# Patient Record
Sex: Male | Born: 1960 | Race: White | Hispanic: No | Marital: Married | Smoking: Former smoker
Health system: Southern US, Community
[De-identification: ages and names within clinical notes are randomized; demographics above are authoritative.]

## PROBLEM LIST (undated history)

## (undated) DIAGNOSIS — J45909 Unspecified asthma, uncomplicated: Secondary | ICD-10-CM

## (undated) DIAGNOSIS — R05 Cough: Secondary | ICD-10-CM

## (undated) DIAGNOSIS — R059 Cough, unspecified: Secondary | ICD-10-CM

## (undated) HISTORY — PX: KNEE SURGERY: SHX244

## (undated) HISTORY — DX: Cough, unspecified: R05.9

## (undated) HISTORY — PX: WISDOM TOOTH EXTRACTION: SHX21

## (undated) HISTORY — DX: Unspecified asthma, uncomplicated: J45.909

## (undated) HISTORY — DX: Cough: R05

---

## 2001-08-05 ENCOUNTER — Emergency Department (HOSPITAL_COMMUNITY): Admission: EM | Admit: 2001-08-05 | Discharge: 2001-08-05 | Payer: Self-pay | Admitting: *Deleted

## 2004-10-28 ENCOUNTER — Ambulatory Visit: Payer: Self-pay | Admitting: Family Medicine

## 2004-11-11 ENCOUNTER — Ambulatory Visit (HOSPITAL_COMMUNITY): Admission: RE | Admit: 2004-11-11 | Discharge: 2004-11-11 | Payer: Self-pay | Admitting: Orthopedic Surgery

## 2005-02-11 ENCOUNTER — Ambulatory Visit: Payer: Self-pay | Admitting: Family Medicine

## 2005-02-12 ENCOUNTER — Ambulatory Visit: Payer: Self-pay | Admitting: Family Medicine

## 2005-09-07 ENCOUNTER — Ambulatory Visit: Payer: Self-pay | Admitting: Family Medicine

## 2005-09-14 ENCOUNTER — Ambulatory Visit: Payer: Self-pay | Admitting: Family Medicine

## 2006-03-24 ENCOUNTER — Ambulatory Visit: Payer: Self-pay | Admitting: Family Medicine

## 2007-02-16 ENCOUNTER — Ambulatory Visit: Payer: Self-pay | Admitting: Family Medicine

## 2010-07-07 ENCOUNTER — Ambulatory Visit: Payer: Self-pay | Admitting: Diagnostic Radiology

## 2010-07-07 ENCOUNTER — Ambulatory Visit (HOSPITAL_BASED_OUTPATIENT_CLINIC_OR_DEPARTMENT_OTHER): Admission: RE | Admit: 2010-07-07 | Discharge: 2010-07-07 | Payer: Self-pay | Admitting: Family Medicine

## 2010-09-13 ENCOUNTER — Encounter: Payer: Self-pay | Admitting: Family Medicine

## 2013-09-19 ENCOUNTER — Encounter: Payer: Self-pay | Admitting: Internal Medicine

## 2013-09-20 ENCOUNTER — Ambulatory Visit (INDEPENDENT_AMBULATORY_CARE_PROVIDER_SITE_OTHER)
Admission: RE | Admit: 2013-09-20 | Discharge: 2013-09-20 | Disposition: A | Discharge status: Home / Self Care | Payer: BC Managed Care – PPO | Source: Ambulatory Visit | Attending: Internal Medicine | Admitting: Internal Medicine

## 2013-09-20 ENCOUNTER — Ambulatory Visit (INDEPENDENT_AMBULATORY_CARE_PROVIDER_SITE_OTHER): Payer: BC Managed Care – PPO | Admitting: Internal Medicine

## 2013-09-20 ENCOUNTER — Encounter: Payer: Self-pay | Admitting: Internal Medicine

## 2013-09-20 ENCOUNTER — Encounter (INDEPENDENT_AMBULATORY_CARE_PROVIDER_SITE_OTHER): Payer: Self-pay

## 2013-09-20 VITALS — BP 104/80 | HR 99 | Temp 97.9°F | Ht 64.0 in | Wt 197.2 lb

## 2013-09-20 DIAGNOSIS — J4540 Moderate persistent asthma, uncomplicated: Secondary | ICD-10-CM | POA: Insufficient documentation

## 2013-09-20 DIAGNOSIS — R05 Cough: Secondary | ICD-10-CM

## 2013-09-20 DIAGNOSIS — R059 Cough, unspecified: Secondary | ICD-10-CM

## 2013-09-20 MED ORDER — PANTOPRAZOLE SODIUM 40 MG PO TBEC: 40.0000 mg | DELAYED_RELEASE_TABLET | Freq: Every day | ORAL | Status: DC

## 2013-09-20 MED ORDER — PREDNISONE 10 MG PO TABS: ORAL_TABLET | ORAL | Status: DC

## 2013-09-20 MED ORDER — TRAMADOL HCL 50 MG PO TABS: ORAL_TABLET | ORAL | Status: DC

## 2013-09-20 MED ORDER — FAMOTIDINE 20 MG PO TABS: ORAL_TABLET | ORAL | Status: DC

## 2013-09-20 NOTE — Progress Notes (Signed)
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  09/20/2013 1st [facility] office visit/ Wert cc new onset dry cough / sob x 4-5 years, with daily reproducible and non variable doe x walking in a hurry or incline, and cough comes and goes with the cold weather, takes cough drops by the handful.    Allergy eval neg by ENT .> no change with gerd rx or neurontin.  Itching/ sneezing spring and fall  Better p zyrtec takes daily plus flonase if misses dose nose gets stuffed up. Does leave off zyrtec in summer s flare in symptoms    Kouffman Reflux v Neurogenic Cough Differentiator Reflux Comments  Do you awaken from a sound sleep coughing violently?                            With trouble breathing? no   Do you have choking episodes when you cannot  Get enough air, gasping for air ?              no   Do you usually cough when you lie down into  The bed, or when you just lie down to rest ?                          no   Do you usually cough after meals or eating?         no   Do you cough when (or after) you bend over?    no   GERD SCORE     Kouffman Reflux v Neurogenic Cough Differentiator Neurogenic   Do you more-or-less cough all day long? yes   Does change of temperature make you cough? yes   Does laughing or chuckling cause you to cough? no   Do fumes (perfume, automobile fumes, burned  Toast, etc.,) cause you to cough ?      sometime   Does speaking, singing, or talking on the phone cause you to cough   ?               Yes    Neurogenic/Airway score       No obvious day to day or daytime variabilty or assoc   cp or chest tightness, subjective wheeze overt sinus or hb symptoms. No unusual exp hx or h/o childhood pna/ asthma or knowledge of premature birth.  Sleeping ok without nocturnal   or early am exacerbation  of respiratory  c/o's or need for noct saba. Also denies any obvious fluctuation of symptoms with weather or environmental changes or other aggravating or alleviating factors except as outlined above   Current Medications, Allergies, Complete Past Medical History, Past Surgical History, Family History, and Social History were reviewed in [employer] record.            Review of Systems  Constitutional: Negative for fever, chills, activity change, appetite change and unexpected weight change.  HENT: Positive for congestion. Negative for dental problem, postnasal drip, rhinorrhea, sneezing, sore throat, trouble swallowing and voice change.   Eyes: Negative for visual disturbance.  Respiratory: Positive for cough and shortness of breath. Negative for choking.   Cardiovascular: Negative for chest pain  and leg swelling.  Gastrointestinal: Negative for nausea, vomiting and abdominal pain.  Genitourinary: Negative for difficulty urinating.  Musculoskeletal: Negative for arthralgias.  Skin: Negative for rash.  Psychiatric/Behavioral: Negative for behavioral problems and confusion.       Objective:   Physical Exam  amb wm nad  Wt Readings from Last 3 Encounters:  09/20/13 197 lb 3.2 oz (89.449 kg)     HEENT: nl dentition, turbinates, and orophanx. Nl external ear canals without cough reflex   NECK :  without JVD/Nodes/TM/ nl carotid upstrokes bilaterally   LUNGS: no acc muscle use, clear to A and P bilaterally with  cough on insp maneuvers (end)   CV:  RRR  no s3 or murmur or increase in P2, no edema   ABD:  soft and nontender with nl excursion in the supine position. No bruits or organomegaly, bowel sounds nl  MS:  warm without deformities, calf tenderness, cyanosis or clubbing  SKIN: warm and dry without lesions    NEURO:  alert, approp, no deficits    CXR  09/20/2013 :   Vague density in the region of the right middle lobe  likely correlating with nodular foci described on prior CT (07/07/10)reflecting subpleural lymph nodes. No further evidence of acute cardiopulmonary disease     Assessment & Plan:

## 2013-09-20 NOTE — Assessment & Plan Note (Signed)
The most common causes of chronic cough in immunocompetent adults include the following: upper airway cough syndrome (UACS), previously referred to as postnasal drip syndrome (PNDS), which is caused by variety of rhinosinus conditions; (2) asthma; (3) GERD; (4) chronic bronchitis from cigarette smoking or other inhaled environmental irritants; (5) nonasthmatic eosinophilic bronchitis; and (6) bronchiectasis.   These conditions, singly or in combination, have accounted for up to 94% of the causes of chronic cough in prospective studies.   Other conditions have constituted no >6% of the causes in prospective studies These have included bronchogenic carcinoma, chronic interstitial pneumonia, sarcoidosis, left ventricular failure, ACEI-induced cough, and aspiration from a condition associated with pharyngeal dysfunction.    Chronic cough is often simultaneously caused by more than one condition. A single cause has been found from 38 to 82% of the time, multiple causes from 18 to 62%. Multiply caused cough has been the result of three diseases up to 42% of the time.       Most likely this is  Classic Upper airway cough syndrome, so named because it's frequently impossible to sort out how much is  CR/sinusitis with freq throat clearing (which can be related to primary GERD)   vs  causing  secondary (" extra esophageal")  GERD from wide swings in gastric pressure that occur with throat clearing, often  promoting self use of mint and menthol lozenges that reduce the lower esophageal sphincter tone and exacerbate the problem further in a cyclical fashion.   These are the same pts (now being labeled as having "irritable larynx syndrome" by some cough centers) who not infrequently have a history of having failed to tolerate ace inhibitors,  dry powder inhalers or biphosphonates or report having atypical reflux symptoms that don't respond to standard doses of PPI , and are easily confused as having aecopd or asthma  flares by even experienced allergists/ pulmonologists.   Will try cyclical cough regimen then regroup.  See instructions for specific recommendations which were reviewed directly with the patient who was given a copy with highlighter outlining the key components.

## 2013-09-20 NOTE — Patient Instructions (Signed)
Pantoprazole (protonix) 40 mg   Take 30-60 min before first meal of the day and Pepcid 20 mg one bedtime until return to office - this is the best way to tell whether stomach acid is contributing to your problem.    Prednisone 10 mg take  4 each am x 2 days,   2 each am x 2 days,  1 each am x 2 days and stop   GERD (REFLUX)  is an extremely common cause of respiratory symptoms, many times with no significant heartburn at all.    It can be treated with medication, but also with lifestyle changes including avoidance of late meals, excessive alcohol, smoking cessation, and avoid fatty foods, chocolate, peppermint, colas, red wine, and acidic juices such as orange juice.  NO MINT OR MENTHOL PRODUCTS SO NO COUGH DROPS  USE SUGARLESS CANDY INSTEAD (jolley ranchers or Stover's/ Life Savers)  NO OIL BASED VITAMINS - use powdered substitutes.  Take delsym two tsp every 12 hours and supplement if needed with  tramadol 50 mg up to 2 every 4 hours to suppress the urge to cough. Swallowing water or using ice chips/non mint and menthol containing candies (such as lifesavers or sugarless jolly ranchers) are also effective.  You should rest your voice and avoid activities that you know make you cough.  Once you have eliminated the cough for 3 straight days try reducing the tramadol first,  then the delsym as tolerated.   Please remember to go to the   x-ray department downstairs for your tests - we will call you with the results when they are available.  Please schedule a follow up office visit in 4 weeks, sooner if needed

## 2013-09-21 NOTE — Progress Notes (Signed)
Quick Note:  LMTCB ______ 

## 2013-09-22 ENCOUNTER — Telehealth: Payer: Self-pay | Admitting: Internal Medicine

## 2013-09-22 NOTE — Telephone Encounter (Signed)
Call pt: Reviewed cxr and no acute change so no change in recommendations made at ov  Spoke with pt and notified of results per Dr. Wert. Pt verbalized understanding and denied any questions.  

## 2013-10-18 ENCOUNTER — Ambulatory Visit (INDEPENDENT_AMBULATORY_CARE_PROVIDER_SITE_OTHER): Payer: BC Managed Care – PPO | Admitting: Internal Medicine

## 2013-10-18 ENCOUNTER — Encounter: Payer: Self-pay | Admitting: Internal Medicine

## 2013-10-18 VITALS — BP 130/86 | HR 100 | Temp 97.9°F | Ht 64.0 in | Wt 195.4 lb

## 2013-10-18 DIAGNOSIS — R05 Cough: Secondary | ICD-10-CM

## 2013-10-18 DIAGNOSIS — R059 Cough, unspecified: Secondary | ICD-10-CM

## 2013-10-18 NOTE — Patient Instructions (Signed)
Please see patient coordinator before you leave today  to schedule methacholine challenge test next available  Until you have the methacholine challenge keep taking the protonix before bfast and pepcid at bedtime

## 2013-10-18 NOTE — Progress Notes (Signed)
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   History of Present Illness  09/20/2013 1st [facility] office visit/ Orra Nolde cc new onset dry cough / sob x 4-5 years, with daily reproducible and non variable doe x walking in a hurry or incline, and cough comes and goes with the cold weather, takes cough drops by the handful.    Allergy eval neg by ENT .> no change with gerd rx or neurontin.  Itching/ sneezing spring and fall  Better p zyrtec takes daily plus flonase if misses dose nose gets stuffed up. Does leave off zyrtec in summer s flare in symptoms rec Cyclical cough rx> did not complete    10/18/2013 f/u ov/Elleigh Cassetta re: yearly cough oct -April x 5 year  Chief Complaint  Patient presents with  . Follow-up    nonprod cough still present but much improved.  Managed by gum/sugarless candies.  maint on  Zyrtec each am Most tramadol he took was one per day If stays home no cough so no reason to take tramadol - turns out only coughs if goes out in cold.    Kouffman Reflux v Neurogenic Cough Differentiator Reflux Comments  Do you awaken from a sound sleep coughing violently?                            With trouble breathing? no   Do you have choking episodes when you cannot  Get enough air, gasping for air ?              no   Do you usually cough when you lie down into  The bed, or when you just lie down to rest ?                          no   Do you usually cough after meals or eating?         no   Do you cough when (or after) you bend over?    no   GERD SCORE     Kouffman Reflux v Neurogenic Cough Differentiator Neurogenic   Do you more-or-less cough all day long? yes   Does change of temperature make you cough? yes   Does laughing or chuckling cause you to cough?  sometimes   Do fumes (perfume, automobile fumes, burned  Toast, etc.,) cause you to cough ?      sometime   Does speaking, singing, or talking on the phone cause you to cough   ?               Yes    Neurogenic/Airway score        No obvious other patterns in  day to day or daytime variabilty or assoc chronic cough or cp or chest tightness, subjective wheeze overt sinus or hb symptoms. No unusual exp hx or h/o childhood pna/ asthma or knowledge of premature birth.  Sleeping ok without nocturnal  or early am exacerbation  of respiratory  c/o's or need for noct saba. Also denies any obvious fluctuation of symptoms with weather or environmental changes or other aggravating or alleviating factors except as outlined above  Current Medications, Allergies, Complete Past Medical History, Past Surgical History, Family History, and Social History were reviewed in [employer] record.  ROS  The following are not active complaints unless bolded sore throat, dysphagia, dental problems, itching, sneezing,  nasal congestion or excess/ purulent secretions, ear ache,   fever, chills, sweats, unintended wt loss, pleuritic or exertional cp, hemoptysis,  orthopnea pnd or leg swelling, presyncope, palpitations, heartburn, abdominal pain, anorexia, nausea, vomiting, diarrhea  or change in bowel or urinary habits, change in stools or urine, dysuria,hematuria,  rash, arthralgias, visual complaints, headache, numbness weakness or ataxia or problems with walking or coordination,  change in mood/affect or memory.         Objective:   Physical Exam  amb wm nad   10/18/2013        195  Wt Readings from Last 3 Encounters:  09/20/13 197 lb 3.2 oz (89.449 kg)     HEENT: nl dentition, turbinates, and orophanx. Nl external ear canals without cough reflex   NECK :  without JVD/Nodes/TM/ nl carotid upstrokes bilaterally   LUNGS: no acc muscle use, clear to A and P bilaterally with  cough on insp  maneuvers    CV:  RRR  no s3 or murmur or increase in P2, no edema   ABD:  soft and nontender with nl excursion in the supine position. No bruits or organomegaly, bowel sounds nl  MS:  warm without deformities, calf tenderness, cyanosis or clubbing  SKIN: warm and dry without lesions          CXR  09/20/2013 : Vague density in the region of the right middle lobe likely correlating with nodular foci described on prior CT (07/07/10)reflecting subpleural lymph nodes. No further evidence of acute cardiopulmonary disease     Assessment & Plan:

## 2013-10-19 NOTE — Assessment & Plan Note (Signed)
The hx has changed somewhat to suggest this only happens in cold weather exposure more typical of asthma but has improved with gerd diet and rx for gerd  Discussed in detail all the  indications, usual  risks and alternatives  relative to the benefits with patient who agrees to proceed with MCT challenge with max gerd rx in meantime.   See instructions for specific recommendations which were reviewed directly with the patient who was given a copy with highlighter outlining the key components.

## 2013-10-24 ENCOUNTER — Telehealth: Payer: Self-pay | Admitting: Internal Medicine

## 2013-10-24 ENCOUNTER — Ambulatory Visit (HOSPITAL_COMMUNITY)
Admission: RE | Admit: 2013-10-24 | Discharge: 2013-10-24 | Disposition: A | Discharge status: Home / Self Care | Payer: BC Managed Care – PPO | Source: Ambulatory Visit | Attending: Internal Medicine | Admitting: Internal Medicine

## 2013-10-24 DIAGNOSIS — R05 Cough: Secondary | ICD-10-CM

## 2013-10-24 DIAGNOSIS — R059 Cough, unspecified: Secondary | ICD-10-CM

## 2013-10-24 LAB — PULMONARY FUNCTION TEST
FEF 25-75 POST: 2.18 L/s
FEF 25-75 Pre: 4.93 L/sec
FEF2575-%Change-Post: -55 %
FEF2575-%PRED-POST: 82 %
FEF2575-%Pred-Pre: 186 %
FEV1-%CHANGE-POST: -18 %
FEV1-%PRED-POST: 83 %
FEV1-%Pred-Pre: 102 %
FEV1-POST: 2.48 L
FEV1-Pre: 3.05 L
FEV1FVC-%CHANGE-POST: -11 %
FEV1FVC-%Pred-Pre: 113 %
FEV6-%Change-Post: -8 %
FEV6-%PRED-PRE: 95 %
FEV6-%Pred-Post: 87 %
FEV6-PRE: 3.48 L
FEV6-Post: 3.19 L
FEV6FVC-%PRED-POST: 104 %
FEV6FVC-%Pred-Pre: 104 %
FVC-%Change-Post: -7 %
FVC-%Pred-Post: 84 %
FVC-%Pred-Pre: 90 %
FVC-Post: 3.22 L
FVC-Pre: 3.48 L
POST FEV6/FVC RATIO: 100 %
PRE FEV6/FVC RATIO: 100 %
Post FEV1/FVC ratio: 77 %
Pre FEV1/FVC ratio: 87 %

## 2013-10-24 MED ORDER — METHACHOLINE 16 MG/ML NEB SOLN: 2.0000 mL | Freq: Once | RESPIRATORY_TRACT | Status: DC

## 2013-10-24 MED ORDER — METHACHOLINE 0.0625 MG/ML NEB SOLN
2.0000 mL | Freq: Once | RESPIRATORY_TRACT | Status: AC
  Administered 2013-10-24: 0.125 mg via RESPIRATORY_TRACT

## 2013-10-24 MED ORDER — METHACHOLINE 1 MG/ML NEB SOLN
2.0000 mL | Freq: Once | RESPIRATORY_TRACT | Status: AC
  Administered 2013-10-24: 2 mg via RESPIRATORY_TRACT

## 2013-10-24 MED ORDER — ALBUTEROL SULFATE (2.5 MG/3ML) 0.083% IN NEBU
2.5000 mg | INHALATION_SOLUTION | Freq: Once | RESPIRATORY_TRACT | Status: AC
Start: 2013-10-24 — End: 2013-10-24
  Administered 2013-10-24: 2.5 mg via RESPIRATORY_TRACT

## 2013-10-24 MED ORDER — METHACHOLINE 4 MG/ML NEB SOLN
2.0000 mL | Freq: Once | RESPIRATORY_TRACT | Status: AC
  Administered 2013-10-24: 8 mg via RESPIRATORY_TRACT

## 2013-10-24 MED ORDER — METHACHOLINE 0.25 MG/ML NEB SOLN
2.0000 mL | Freq: Once | RESPIRATORY_TRACT | Status: AC
  Administered 2013-10-24: 0.5 mg via RESPIRATORY_TRACT

## 2013-10-24 MED ORDER — SODIUM CHLORIDE 0.9 % IN NEBU
3.0000 mL | INHALATION_SOLUTION | Freq: Once | RESPIRATORY_TRACT | Status: AC
  Administered 2013-10-24: 3 mL via RESPIRATORY_TRACT

## 2013-10-24 NOTE — Telephone Encounter (Signed)
Called, spoke with Tammy in the RT lab.  She was calling to see if pt could have another breathing tx, but states pt says he feels fine.  Reports since she made call, pt has declined another breathing tx stating he "feels fine."  Per Tammy, nothing further is needed at this time.

## 2013-10-25 ENCOUNTER — Encounter: Payer: Self-pay | Admitting: Internal Medicine

## 2013-10-26 ENCOUNTER — Telehealth: Payer: Self-pay | Admitting: *Deleted

## 2013-10-26 NOTE — Telephone Encounter (Signed)
Patient scheduled for appt with MW 10/27/13 at 130

## 2013-10-26 NOTE — Telephone Encounter (Signed)
Pt returning call pls call @ [phone].Caren GriffinsStanley A Dalton

## 2013-10-26 NOTE — Telephone Encounter (Signed)
LMTCB

## 2013-10-26 NOTE — Telephone Encounter (Signed)
Message copied by Christen ButterASKIN, Trenia Tennyson M on Thu Oct 26, 2013  9:14 AM ------      Message from: Sandrea HughsWERT, MICHAEL B      Created: Wed Oct 25, 2013  8:58 PM       Methacholine challenge test suggests asthma > needs ov to discuss options for long term rx  ------

## 2013-10-27 ENCOUNTER — Ambulatory Visit (INDEPENDENT_AMBULATORY_CARE_PROVIDER_SITE_OTHER): Payer: BC Managed Care – PPO | Admitting: Internal Medicine

## 2013-10-27 ENCOUNTER — Encounter: Payer: Self-pay | Admitting: Internal Medicine

## 2013-10-27 VITALS — BP 112/72 | HR 87 | Temp 97.8°F | Ht 64.0 in | Wt 195.0 lb

## 2013-10-27 DIAGNOSIS — R05 Cough: Secondary | ICD-10-CM

## 2013-10-27 DIAGNOSIS — R059 Cough, unspecified: Secondary | ICD-10-CM

## 2013-10-27 MED ORDER — BUDESONIDE-FORMOTEROL FUMARATE 80-4.5 MCG/ACT IN AERO: INHALATION_SPRAY | RESPIRATORY_TRACT | Status: DC

## 2013-10-27 NOTE — Patient Instructions (Addendum)
symbicort 80 Take 2 puffs first thing in am and then another 2 puffs about 12 hours later ( when better stop the evening dose - if not better return)  If doing great after a few weeks, ok to stop acid suppression  Please schedule a follow up visit in 3 months but call sooner if needed

## 2013-10-27 NOTE — Assessment & Plan Note (Signed)
-   MCT 10/24/13 POS at 4.0   Discussed in detail all the  indications, usual  risks and alternatives  relative to the benefits with patient who agrees to proceed with  ics rather than singulair    - 10/27/2013 p extensive coaching HFA effectiveness =    75% > start symbicort 80 2bid

## 2013-10-27 NOTE — Progress Notes (Signed)
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   History of Present Illness  09/20/2013 1st [facility] office visit/ Arneshia Ade cc new onset dry cough / sob x 4-5 years, with daily reproducible and non variable doe x walking in a hurry or incline, and cough comes and goes with the cold weather, takes cough drops by the handful.    Allergy eval neg by ENT .> no change with gerd rx or neurontin.  Itching/ sneezing spring and fall  Better p zyrtec takes daily plus flonase if misses dose nose gets stuffed up. Does leave off zyrtec in summer s flare in symptoms rec Cyclical cough rx> did not complete    10/18/2013 f/u ov/Krishav Mamone re: yearly cough oct -April x 5 year  Chief Complaint  Patient presents with  . Follow-up    nonprod cough still present but much improved.  Managed by gum/sugarless candies.  maint on  Zyrtec each am Most tramadol he took was one per day If stays home no cough so no reason to take tramadol - turns out only coughs if goes out in cold.    Kouffman Reflux v Neurogenic Cough Differentiator Reflux Comments  Do you awaken from a sound sleep coughing violently?                            With trouble breathing? no   Do you have choking episodes when you cannot  Get enough air, gasping for air ?              no   Do you usually cough when you lie down into  The bed, or when you just lie down to rest ?                          no   Do you usually cough after meals or eating?         no   Do you cough when (or after) you bend over?    no   GERD SCORE     Kouffman Reflux v Neurogenic Cough Differentiator Neurogenic   Do you more-or-less cough all day long? yes   Does change of temperature make you cough? yes   Does laughing or chuckling cause you to cough?  sometimes   Do fumes (perfume, automobile fumes, burned  Toast, etc.,) cause you to cough ?      sometime   Does speaking, singing, or talking on the phone cause you to cough   ?               Yes    Neurogenic/Airway score      rec MCT > pos at 4.0/ reproduced all his symptoms at 90%, better again p saba   10/27/2013 f/u ov/Shyler Hamill re: rx for cough variant asthma Chief Complaint  Patient presents with  . Follow-up    Cough is unchanged since his last visit.        No obvious other patterns in  day to day or daytime variabilty or assoc chronic cough or cp or chest tightness, subjective wheeze overt sinus or hb symptoms. No unusual exp hx or h/o  childhood pna/ asthma or knowledge of premature birth.  Sleeping ok without nocturnal  or early am exacerbation  of respiratory  c/o's or need for noct saba. Also denies any obvious fluctuation of symptoms with weather or environmental changes or other aggravating or alleviating factors except as outlined above   Current Medications, Allergies, Complete Past Medical History, Past Surgical History, Family History, and Social History were reviewed in [facility] electronic medical record.  ROS  The following are not active complaints unless bolded sore throat, dysphagia, dental problems, itching, sneezing,  nasal congestion or excess/ purulent secretions, ear ache,   fever, chills, sweats, unintended wt loss, pleuritic or exertional cp, hemoptysis,  orthopnea pnd or leg swelling, presyncope, palpitations, heartburn, abdominal pain, anorexia, nausea, vomiting, diarrhea  or change in bowel or urinary habits, change in stools or urine, dysuria,hematuria,  rash, arthralgias, visual complaints, headache, numbness weakness or ataxia or problems with walking or coordination,  change in mood/affect or memory.         Objective:   Physical Exam  amb wm nad   10/18/2013        195 > 10/27/2013 193  Wt Readings from Last 3 Encounters:  09/20/13 197 lb 3.2 oz  (89.449 kg)     HEENT: nl dentition, turbinates, and orophanx. Nl external ear canals without cough reflex   NECK :  without JVD/Nodes/TM/ nl carotid upstrokes bilaterally   LUNGS: no acc muscle use, clear to A and P bilaterally     CV:  RRR  no s3 or murmur or increase in P2, no edema   ABD:  soft and nontender with nl excursion in the supine position. No bruits or organomegaly, bowel sounds nl  MS:  warm without deformities, calf tenderness, cyanosis or clubbing  SKIN: warm and dry without lesions          CXR  09/20/2013 : Vague density in the region of the right middle lobe likely correlating with nodular foci described on prior CT (07/07/10)reflecting subpleural lymph nodes. No further evidence of acute cardiopulmonary disease     Assessment & Plan:

## 2013-12-31 ENCOUNTER — Encounter (HOSPITAL_BASED_OUTPATIENT_CLINIC_OR_DEPARTMENT_OTHER): Payer: Self-pay | Admitting: Emergency Medicine

## 2013-12-31 ENCOUNTER — Emergency Department (HOSPITAL_BASED_OUTPATIENT_CLINIC_OR_DEPARTMENT_OTHER): Payer: BC Managed Care – PPO

## 2013-12-31 ENCOUNTER — Emergency Department (HOSPITAL_BASED_OUTPATIENT_CLINIC_OR_DEPARTMENT_OTHER)
Admission: EM | Admit: 2013-12-31 | Discharge: 2013-12-31 | Disposition: A | Discharge status: Home / Self Care | Payer: BC Managed Care – PPO | Attending: Emergency Medicine | Admitting: Emergency Medicine

## 2013-12-31 DIAGNOSIS — J209 Acute bronchitis, unspecified: Secondary | ICD-10-CM

## 2013-12-31 DIAGNOSIS — Z87891 Personal history of nicotine dependence: Secondary | ICD-10-CM | POA: Insufficient documentation

## 2013-12-31 DIAGNOSIS — IMO0002 Reserved for concepts with insufficient information to code with codable children: Secondary | ICD-10-CM | POA: Insufficient documentation

## 2013-12-31 DIAGNOSIS — J45909 Unspecified asthma, uncomplicated: Secondary | ICD-10-CM | POA: Insufficient documentation

## 2013-12-31 DIAGNOSIS — Z79899 Other long term (current) drug therapy: Secondary | ICD-10-CM | POA: Insufficient documentation

## 2013-12-31 MED ORDER — ALBUTEROL SULFATE HFA 108 (90 BASE) MCG/ACT IN AERS
INHALATION_SPRAY | RESPIRATORY_TRACT | Status: AC
  Administered 2013-12-31: 2 via RESPIRATORY_TRACT
  Filled 2013-12-31: qty 6.7

## 2013-12-31 MED ORDER — HYDROCOD POLST-CHLORPHEN POLST 10-8 MG/5ML PO LQCR
5.0000 mL | Freq: Once | ORAL | Status: AC
  Administered 2013-12-31: 5 mL via ORAL
  Filled 2013-12-31: qty 5

## 2013-12-31 MED ORDER — HYDROCOD POLST-CHLORPHEN POLST 10-8 MG/5ML PO LQCR: 5.0000 mL | Freq: Two times a day (BID) | ORAL | Status: DC | PRN

## 2013-12-31 MED ORDER — ALBUTEROL SULFATE HFA 108 (90 BASE) MCG/ACT IN AERS
2.0000 | INHALATION_SPRAY | RESPIRATORY_TRACT | Status: DC | PRN
  Administered 2013-12-31 (×2): 2 via RESPIRATORY_TRACT

## 2013-12-31 NOTE — ED Notes (Signed)
Pt reports cough with intermittent periods of shortness of breath, runny nose, watery eyes, mild sore throat, that started Thursday.

## 2013-12-31 NOTE — Discharge Instructions (Signed)
Acute Bronchitis Bronchitis is inflammation of the airways that extend from the windpipe into the lungs (bronchi). The inflammation often causes mucus to develop. This leads to a cough, which is the most common symptom of bronchitis.  In acute bronchitis, the condition usually develops suddenly and goes away over time, usually in a couple weeks. Smoking, allergies, and asthma can make bronchitis worse. Repeated episodes of bronchitis may cause further lung problems.  CAUSES Acute bronchitis is most often caused by the same virus that causes a cold. The virus can spread from person to person (contagious).  SIGNS AND SYMPTOMS   Cough.   Fever.   Coughing up mucus.   Body aches.   Chest congestion.   Chills.   Shortness of breath.   Sore throat.  DIAGNOSIS  Acute bronchitis is usually diagnosed through a physical exam. Tests, such as chest X-rays, are sometimes done to rule out other conditions.  TREATMENT  Acute bronchitis usually goes away in a couple weeks. Often times, no medical treatment is necessary. Medicines are sometimes given for relief of fever or cough. Antibiotics are usually not needed but may be prescribed in certain situations. In some cases, an inhaler may be recommended to help reduce shortness of breath and control the cough. A cool mist vaporizer may also be used to help thin bronchial secretions and make it easier to clear the chest.  HOME CARE INSTRUCTIONS  Get plenty of rest.   Drink enough fluids to keep your urine clear or pale yellow (unless you have a medical condition that requires fluid restriction). Increasing fluids may help thin your secretions and will prevent dehydration.   Only take over-the-counter or prescription medicines as directed by your health care provider.   Avoid smoking and secondhand smoke. Exposure to cigarette smoke or irritating chemicals will make bronchitis worse. If you are a smoker, consider using nicotine gum or skin  patches to help control withdrawal symptoms. Quitting smoking will help your lungs heal faster.   Reduce the chances of another bout of acute bronchitis by washing your hands frequently, avoiding people with cold symptoms, and trying not to touch your hands to your mouth, nose, or eyes.   Follow up with your health care provider as directed.  SEEK MEDICAL CARE IF: Your symptoms do not improve after 1 week of treatment.  SEEK IMMEDIATE MEDICAL CARE IF:  You develop an increased fever or chills.   You have chest pain.   You have severe shortness of breath.  You have bloody sputum.   You develop dehydration.  You develop fainting.  You develop repeated vomiting.  You develop a severe headache. MAKE SURE YOU:   Understand these instructions.  Will watch your condition.  Will get help right away if you are not doing well or get worse. Document Released: 09/17/2004 Document Revised: 04/12/2013 Document Reviewed: 01/31/2013 [employer] Patient Information 2014 [employer].  

## 2013-12-31 NOTE — ED Provider Notes (Signed)
CSN: [id number]     Arrival date & time 12/31/13  0534 History   First MD Initiated Contact with Patient 12/31/13 [phone]     Chief Complaint  Patient presents with  . Cough     (Consider location/radiation/quality/duration/timing/severity/associated sxs/prior Treatment) HPI This is a 53 year old male with a history of asthma and seasonal bronchitis. He developed a scratchy throat 4 days ago. This was followed by a cough 2 days later. The cough has worsened and has been severe at times. It is occasionally productive of brown sputum. He has had wheezing and mild shortness of breath. He denies fever. He does not have an albuterol inhaler but does use Symbicort.  Past Medical History  Diagnosis Date  . Asthma   . Cough    Past Surgical History  Procedure Laterality Date  . Knee surgery     Family History  Problem Relation Age of Onset  . Hypertension Father    History  Substance Use Topics  . Smoking status: Former Smoker -- 1.00 packs/day for .5 years    Types: Cigarettes    Quit date: 08/24/1992  . Smokeless tobacco: Never Used  . Alcohol Use: No    Review of Systems  All other systems reviewed and are negative.   Allergies  Review of patient's allergies indicates no known allergies.  Home Medications   Prior to Admission medications   Medication Sig Start Date End Date Taking? Authorizing Provider  budesonide-formoterol (SYMBICORT) 80-4.5 MCG/ACT inhaler Take 2 puffs first thing in am and then another 2 puffs about 12 hours later. 10/27/13  Yes Nyoka CowdenMichael B Wert, MD  cetirizine (ZYRTEC) 10 MG tablet Take 10 mg by mouth daily.    Yes Historical Provider, MD  fluticasone (FLONASE) 50 MCG/ACT nasal spray Place 1 spray into both nostrils as needed.    Yes Historical Provider, MD  traMADol (ULTRAM) 50 MG tablet 1-2 every 4 hours as needed for cough or pain 09/20/13  Yes Nyoka CowdenMichael B Wert, MD  famotidine (PEPCID) 20 MG tablet One at bedtime 09/20/13   Nyoka CowdenMichael B Wert, MD  pantoprazole  (PROTONIX) 40 MG tablet Take 1 tablet (40 mg total) by mouth daily. Take 30-60 min before first meal of the day 09/20/13   Nyoka CowdenMichael B Wert, MD   BP 122/85  Pulse 90  Temp(Src) 98 F (36.7 C) (Oral)  Resp 18  SpO2 96%  Physical Exam General: Well-developed, well-nourished male in no acute distress; appearance consistent with age of record HENT: normocephalic; atraumatic; normal pharynx Eyes: pupils equal, round and reactive to light; extraocular muscles intact Neck: supple; no lymphadenopathy Heart: regular rate and rhythm; no murmurs, rubs or gallops Lungs: clear to auscultation bilaterally Abdomen: soft; nondistended; nontender; no masses or hepatosplenomegaly; bowel sounds present Extremities: No deformity; full range of motion; pulses normal Neurologic: Awake, alert and oriented; motor function intact in all extremities and symmetric; no facial droop Skin: Warm and dry Psychiatric: Normal mood and affect    ED Course  Procedures (including critical care time)   MDM  Nursing notes and vitals signs, including pulse oximetry, reviewed.  Summary of this visit's results, reviewed by myself:  Imaging Studies: Dg Chest 2 View  12/31/2013   CLINICAL DATA:  Cough  EXAM: CHEST  2 VIEW  COMPARISON:  09/20/2008  FINDINGS: There is a subtle opacity at the right base which is unchanged or even decreased from prior. There is a 7mm nodule near the minor fissure seen in the lateral projection, grossly stable from  chest CT in 2011. Normal heart size. No edema, effusion, or pneumothorax.  IMPRESSION: When accounting for right middle lobe scarring and nodularity, no evidence of acute cardiopulmonary disease.   Electronically Signed   By: Tiburcio PeaJonathan  Watts M.D.   On: 12/31/2013 06:52        Hanley SeamenJohn L Vidit Boissonneault, MD 12/31/13 [phone]

## 2014-02-27 ENCOUNTER — Encounter: Payer: Self-pay | Admitting: Internal Medicine

## 2014-02-27 ENCOUNTER — Ambulatory Visit (INDEPENDENT_AMBULATORY_CARE_PROVIDER_SITE_OTHER): Payer: BC Managed Care – PPO | Admitting: Internal Medicine

## 2014-02-27 VITALS — BP 110/70 | HR 83 | Temp 97.9°F | Ht 64.0 in | Wt 189.0 lb

## 2014-02-27 DIAGNOSIS — J45991 Cough variant asthma: Secondary | ICD-10-CM

## 2014-02-27 MED ORDER — FAMOTIDINE 20 MG PO TABS: ORAL_TABLET | ORAL | Status: DC

## 2014-02-27 MED ORDER — PANTOPRAZOLE SODIUM 40 MG PO TBEC: 40.0000 mg | DELAYED_RELEASE_TABLET | Freq: Every day | ORAL | Status: DC

## 2014-02-27 MED ORDER — ALBUTEROL SULFATE HFA 108 (90 BASE) MCG/ACT IN AERS: INHALATION_SPRAY | RESPIRATORY_TRACT | Status: DC

## 2014-02-27 NOTE — Assessment & Plan Note (Signed)
-   MCT 10/24/13 POS at 4.0  - rx symbicort 80 2bid 10/27/13   DDX of  difficult airways management all start with A and  include Adherence, Ace Inhibitors, Acid Reflux, Active Sinus Disease, Alpha 1 Antitripsin deficiency, Anxiety masquerading as Airways dz,  ABPA,  allergy(esp in young), Aspiration (esp in elderly), Adverse effects of DPI,  Active smokers, plus two Bs  = Bronchiectasis and Beta blocker use..and one C= CHF  Adherence is always the initial "prime suspect" and is a multilayered concern that requires a "trust but verify" approach in every patient - starting with knowing how to use medications, especially inhalers, correctly, keeping up with refills and understanding the fundamental difference between maintenance and prns vs those medications only taken for a very short course and then stopped and not refilled.  The proper method of use, as well as anticipated side effects, of a metered-dose inhaler are discussed and demonstrated to the patient. Improved effectiveness after extensive coaching during this visit to a level of approximately  90% from a baseline of < 50%  ? Acid (or non-acid) GERD > always difficult to exclude as up to 75% of pts in some series report no assoc GI/ Heartburn symptoms> rec max (24h)  acid suppression and diet restrictions/ reviewed and instructions given in writing.     Each maintenance medication was reviewed in detail including most importantly the difference between maintenance and as needed and under what circumstances the prns are to be used.  Please see instructions for details which were reviewed in writing and the patient given a copy.

## 2014-02-27 NOTE — Progress Notes (Signed)
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    Allergy eval neg by ENT .> no change with gerd rx or neurontin.  Itching/ sneezing spring and fall  Better p zyrtec takes daily plus flonase if misses dose nose gets stuffed up. Does leave off zyrtec in summer s flare in symptoms rec Cyclical cough rx> did not complete    10/18/2013 f/u ov/Cherolyn Behrle re: yearly cough oct -April x 5 year  Chief Complaint  Patient presents with  . Follow-up    nonprod cough still present but much improved.  Managed by gum/sugarless candies.  maint on  Zyrtec each am Most tramadol he took was one per day If stays home no cough so no reason to take tramadol - turns out only coughs if goes out in cold.    Kouffman Reflux v Neurogenic Cough Differentiator Reflux Comments  Do you awaken from a sound sleep coughing violently?                            With trouble breathing? no   Do you have choking episodes when you cannot  Get enough air, gasping for air ?              no   Do you usually cough when you lie down into  The bed, or when you just lie down to rest ?                          no   Do you usually cough after meals or eating?         no   Do you cough when (or after) you bend over?    no   GERD SCORE     Kouffman Reflux v Neurogenic Cough Differentiator Neurogenic   Do you more-or-less cough all day long? yes   Does change of temperature make you cough? yes    Does laughing or chuckling cause you to cough? sometimes   Do fumes (perfume, automobile fumes, burned  Toast, etc.,) cause you to cough ?      sometime   Does speaking, singing, or talking on the phone cause you to cough   ?               Yes    Neurogenic/Airway score      rec MCT > pos at 4.0/ reproduced all his symptoms at 90%, better again p saba   10/27/2013 f/u ov/Bryauna Byrum re: rx for cough variant asthma Chief Complaint  Patient presents with  . Follow-up    Cough is unchanged since his last visit.   rec symbicort 80 Take 2 puffs first thing in am and then another 2 puffs about 12 hours later ( when better stop the evening dose - if  not better return) If doing great after a few weeks, ok to stop acid suppression   02/27/2014 f/u ov/Jennine Peddy re: cough variant asthma/ one flare to ER 12/31/13 did not activate gerd plan Chief Complaint  Patient presents with  . Follow-up    Pt states that his cough is some better since last visit. No new co's today.   has ventolin/spacer not using it. Mostly dry daytime cough.   No obvious other patterns in  day to day or daytime variability or cp or chest tightness, subjective wheeze overt sinus or hb symptoms. No unusual exp hx or h/o childhood pna/ asthma or knowledge of premature birth.  Sleeping ok without nocturnal  or early am exacerbation  of respiratory  c/o's or need for noct saba. Also denies any obvious fluctuation of symptoms with weather or environmental changes or other aggravating or alleviating factors except as outlined above   Current Medications, Allergies, Complete Past Medical History, Past Surgical History, Family History, and Social History were reviewed in [facility] electronic medical record.  ROS  The following are not active complaints unless bolded sore throat, dysphagia, dental problems, itching, sneezing,  nasal congestion or excess/ purulent secretions, ear ache,   fever, chills, sweats, unintended wt loss, pleuritic or  exertional cp, hemoptysis,  orthopnea pnd or leg swelling, presyncope, palpitations, heartburn, abdominal pain, anorexia, nausea, vomiting, diarrhea  or change in bowel or urinary habits, change in stools or urine, dysuria,hematuria,  rash, arthralgias, visual complaints, headache, numbness weakness or ataxia or problems with walking or coordination,  change in mood/affect or memory.         Objective:   Physical Exam  amb wm nad   10/18/2013        195 > 10/27/2013 193 > 189 02/27/2014  Wt Readings from Last 3 Encounters:  09/20/13 197 lb 3.2 oz (89.449 kg)     HEENT: nl dentition, turbinates, and orophanx. Nl external ear canals without cough reflex   NECK :  without JVD/Nodes/TM/ nl carotid upstrokes bilaterally   LUNGS: no acc muscle use, clear to A and P bilaterally     CV:  RRR  no s3 or murmur or increase in P2, no edema   ABD:  soft and nontender with nl excursion in the supine position. No bruits or organomegaly, bowel sounds nl  MS:  warm without deformities, calf tenderness, cyanosis or clubbing  SKIN: warm and dry without lesions          CXR  09/20/2013 : Vague density in the region of the right middle lobe likely correlating with nodular foci described on prior CT (07/07/10)reflecting subpleural lymph nodes. No further evidence of acute cardiopulmonary disease     Assessment & Plan:

## 2014-02-27 NOTE — Patient Instructions (Addendum)
Work on inhaler technique:  relax and gently blow all the way out then take a nice smooth deep breath back in, triggering the inhaler at same time you start breathing in.  Hold for up to 5 seconds if you can.  Rinse and gargle with water when done  As long as any cough at all > Pantoprazole (protonix) 40 mg   Take 30-60 min before first meal of the day and Pepcid 20 mg one bedtime    Only use your albuterol(ventolin/proair) as a rescue medication to be used if you can't catch your breath by resting or doing a relaxed purse lip breathing pattern.  - The less you use it, the better it will work when you need it. - Ok to use up to 2 puffs  every 4 hours if you must but call for immediate appointment if use goes up over your usual need - Don't leave home without it !!  (think of it like the spare tire for your car)   Please schedule a follow up visit in 6 months but call sooner if needed

## 2014-06-26 ENCOUNTER — Ambulatory Visit (INDEPENDENT_AMBULATORY_CARE_PROVIDER_SITE_OTHER): Payer: BC Managed Care – PPO | Admitting: Internal Medicine

## 2014-06-26 ENCOUNTER — Other Ambulatory Visit (INDEPENDENT_AMBULATORY_CARE_PROVIDER_SITE_OTHER): Payer: BC Managed Care – PPO

## 2014-06-26 ENCOUNTER — Encounter: Payer: Self-pay | Admitting: Internal Medicine

## 2014-06-26 DIAGNOSIS — J45991 Cough variant asthma: Secondary | ICD-10-CM

## 2014-06-26 LAB — CBC WITH DIFFERENTIAL/PLATELET
BASOS PCT: 0.3 % (ref 0.0–3.0)
Basophils Absolute: 0 10*3/uL (ref 0.0–0.1)
EOS PCT: 0.4 % (ref 0.0–5.0)
Eosinophils Absolute: 0 10*3/uL (ref 0.0–0.7)
HCT: 48.4 % (ref 39.0–52.0)
Hemoglobin: 16.4 g/dL (ref 13.0–17.0)
LYMPHS ABS: 2 10*3/uL (ref 0.7–4.0)
Lymphocytes Relative: 25.5 % (ref 12.0–46.0)
MCHC: 33.8 g/dL (ref 30.0–36.0)
MCV: 95.2 fl (ref 78.0–100.0)
MONO ABS: 0.8 10*3/uL (ref 0.1–1.0)
Monocytes Relative: 10 % (ref 3.0–12.0)
Neutro Abs: 5.1 10*3/uL (ref 1.4–7.7)
Neutrophils Relative %: 63.8 % (ref 43.0–77.0)
PLATELETS: 243 10*3/uL (ref 150.0–400.0)
RBC: 5.08 Mil/uL (ref 4.22–5.81)
RDW: 13.5 % (ref 11.5–15.5)
WBC: 8 10*3/uL (ref 4.0–10.5)

## 2014-06-26 MED ORDER — TRAMADOL HCL 50 MG PO TABS
ORAL_TABLET | ORAL | Status: DC
Start: 2014-06-26 — End: 2014-09-10

## 2014-06-26 MED ORDER — PREDNISONE 10 MG PO TABS: ORAL_TABLET | ORAL | Status: DC

## 2014-06-26 NOTE — Patient Instructions (Addendum)
Prednisone 10 mg take  4 each am x 2 days,   2 each am x 2 days,  1 each am x 2 days and stop   Take delsym two tsp every 12 hours and supplement if needed with  tramadol 50 mg up to 2 every 4 hours to suppress the urge to cough. Swallowing water or using ice chips/non mint and menthol containing candies (such as lifesavers or sugarless jolly ranchers) are also effective.  You should rest your voice and avoid activities that you know make you cough.  Once you have eliminated the cough for 3 straight days try reducing the tramadol first,  then the delsym as tolerated.   Please remember to go to the lab  department downstairs for your tests - we will call you with the results when they are available.     Please schedule a follow up visit in 3 months but call sooner if needed  Late add consider adding singulair next

## 2014-06-26 NOTE — Progress Notes (Signed)
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    Allergy eval neg by ENT .> no change with gerd rx or neurontin.  Itching/ sneezing spring and fall  Better p zyrtec takes daily plus flonase if misses dose nose gets stuffed up. Does leave off zyrtec in summer s flare in symptoms rec Cyclical cough rx> did not complete    10/18/2013 f/u ov/Frady Taddeo re: yearly cough oct -April x 5 year  Chief Complaint  Patient presents with  . Follow-up    nonprod cough still present but much improved.  Managed by gum/sugarless candies.  maint on  Zyrtec each am Most tramadol he took was one per day If stays home no cough so no reason to take tramadol - turns out only coughs if goes out in cold.    Kouffman Reflux v Neurogenic Cough Differentiator Reflux Comments  Do you awaken from a sound sleep coughing violently?                            With trouble breathing? no   Do you have choking episodes when you cannot  Get enough air, gasping for air ?              no   Do you usually cough when you lie down into  The bed, or when you just lie down to rest ?                          no   Do you usually cough after meals or eating?         no   Do you cough when (or after) you bend over?    no   GERD SCORE     Kouffman Reflux v Neurogenic Cough Differentiator Neurogenic   Do you more-or-less cough all day long? yes   Does change of temperature make you cough? yes    Does laughing or chuckling cause you to cough? sometimes   Do fumes (perfume, automobile fumes, burned  Toast, etc.,) cause you to cough ?      sometime   Does speaking, singing, or talking on the phone cause you to cough   ?               Yes    Neurogenic/Airway score      rec MCT > pos at 4.0/ reproduced all his symptoms at 90%, better again p saba   10/27/2013 f/u ov/Kisa Fujii re: rx for cough variant asthma Chief Complaint  Patient presents with  . Follow-up    Cough is unchanged since his last visit.   rec symbicort 80 Take 2 puffs first thing in am and then another 2 puffs about 12 hours later ( when better stop the evening dose - if  not better return) If doing great after a few weeks, ok to stop acid suppression   02/27/2014 f/u ov/Okechukwu Regnier re: cough variant asthma/ one flare to ER 12/31/13 did not activate gerd plan Chief Complaint  Patient presents with  . Follow-up    Pt states that his cough is some better since last visit. No new co's today.   has ventolin/spacer not using it. Mostly dry daytime cough rec Work on inhaler technique: ne As long as any cough at all > Pantoprazole (protonix) 40 mg   Take 30-60 min before first meal of the day and Pepcid 20 mg one bedtime   Only use your albuterol(ventolin/proair)    06/26/2014 f/u ov/Alfreida Steffenhagen re: maint on protonix / pepcid / symbicort 80  Chief Complaint  Patient presents with  . Acute Visit    c/o cough x 2-3 days, stays hoarse all the time,works outside,sob with exertion,no wheezing,denies fcs,no cp or tightness,pnd,sorethroat from coughing  typically no noct cough, more daytime      No obvious other patterns in  day to day or daytime variability or cp or chest tightness, subjective wheeze overt sinus or hb symptoms. No unusual exp hx or h/o childhood pna/ asthma or knowledge of premature birth.  Sleeping ok without nocturnal  or early am exacerbation  of respiratory  c/o's or need for noct saba. Also denies any obvious fluctuation  of symptoms with weather or environmental changes or other aggravating or alleviating factors except as outlined above   Current Medications, Allergies, Complete Past Medical History, Past Surgical History, Family History, and Social History were reviewed in [facility] electronic medical record.  ROS  The following are not active complaints unless bolded sore throat, dysphagia, dental problems, itching, sneezing,  nasal congestion or excess/ purulent secretions, ear ache,   fever, chills, sweats, unintended wt loss, pleuritic or exertional cp, hemoptysis,  orthopnea pnd or leg swelling, presyncope, palpitations, heartburn, abdominal pain, anorexia, nausea, vomiting, diarrhea  or change in bowel or urinary habits, change in stools or urine, dysuria,hematuria,  rash, arthralgias, visual complaints, headache, numbness weakness or ataxia or problems with walking or coordination,  change in mood/affect or memory.         Objective:   Physical Exam  amb wm nad   10/18/2013        195 > 10/27/2013 193 > 189 02/27/2014 > 06/26/2014 189  Wt Readings from Last 3 Encounters:  09/20/13 197 lb 3.2 oz (89.449 kg)     HEENT: nl dentition, turbinates, and orophanx. Nl external ear canals without cough reflex   NECK :  without JVD/Nodes/TM/ nl carotid upstrokes bilaterally   LUNGS: no acc muscle use, trace exp rhonchi/ exp cough     CV:  RRR  no s3 or murmur or increase in P2, no edema   ABD:  soft and nontender with nl excursion in the supine position. No bruits or organomegaly, bowel sounds nl  MS:  warm without deformities, calf tenderness, cyanosis or clubbing  SKIN: warm and dry without lesions          CXR  09/20/2013 : Vague density in the region of the right middle lobe likely correlating with nodular foci described on prior CT (07/07/10)reflecting subpleural lymph nodes. No further evidence of acute cardiopulmonary disease     Assessment & Plan:

## 2014-06-27 LAB — ALLERGY FULL PROFILE
Allergen, D pternoyssinus,d7: 0.38 kU/L — ABNORMAL HIGH
Alternaria Alternata: <0.1 kU/L
Aspergillus fumigatus, m3: <0.1 kU/L
BERMUDA GRASS: 0.24 kU/L — AB
Bahia Grass: <0.1 kU/L
Cat Dander: <0.1 kU/L
Common Ragweed: <0.1 kU/L
Curvularia lunata: <0.1 kU/L
D. farinae: 0.28 kU/L — ABNORMAL HIGH
Dog Dander: <0.1 kU/L
Elm IgE: <0.1 kU/L
Fescue: <0.1 kU/L
G009 Red Top: <0.1 kU/L
Goldenrod: <0.1 kU/L
Helminthosporium halodes: <0.1 kU/L
House Dust Hollister: <0.1 kU/L
IGE (IMMUNOGLOBULIN E), SERUM: 59 kU/L (ref <115)
Lamb's Quarters: <0.1 kU/L
Plantain: <0.1 kU/L
Sycamore Tree: <0.1 kU/L

## 2014-06-27 NOTE — Progress Notes (Signed)
Quick Note:  LMTCB ______ 

## 2014-06-27 NOTE — Progress Notes (Signed)
Quick Note:  Spoke with pt and notified of results per Dr. Wert. Pt verbalized understanding and denied any questions.  ______ 

## 2014-06-29 ENCOUNTER — Telehealth: Payer: Self-pay | Admitting: *Deleted

## 2014-06-29 NOTE — Assessment & Plan Note (Addendum)
-   MCT 10/24/13 POS at 4.0  - rx symbicort 80 2bid 10/27/13  - 02/27/2014 p extensive coaching HFA effectiveness =   90%  - Allergy profile 06/26/14 > Neg Eos/  Ig E 59, min RAST pos dust and grass   Mild flare ? Etiology with ? Superimposed tendency to cyclical cough ?viral uri? Vs allergic mech  Explained the natural history of uri and why it's necessary in patients at risk to treat GERD aggressively - at least  short term -   to reduce risk of evolving cyclical cough initially  triggered by epithelial injury and a heightened sensitivty to the effects of any upper airway irritants,  most importantly acid - related - then perpetuated by epithelial injury related to the cough itself as the upper airway collapses on itself.  That is, the more sensitive the epithelium becomes once it is damaged by the virus, the more the ensuing irritability> the more the cough, the more the secondary reflux (especially in those prone to reflux) the more the irritation of the sensitive mucosa and so on in a  Classic cyclical pattern.    Next add singulair maint if not better

## 2014-06-29 NOTE — Telephone Encounter (Signed)
LMTCB

## 2014-06-29 NOTE — Telephone Encounter (Signed)
-----   Message from Nyoka CowdenMichael B Wert, MD sent at 06/29/2014  6:29 AM EST ----- Call to check on how he's doing and if not a lot better add singulair 10 mg each pm until next ov

## 2014-07-04 NOTE — Telephone Encounter (Signed)
LMTCB

## 2014-07-04 NOTE — Telephone Encounter (Signed)
Pt returning call.Antonio Holmes ° °

## 2014-07-05 NOTE — Telephone Encounter (Signed)
LMTCB

## 2014-07-11 MED ORDER — MONTELUKAST SODIUM 10 MG PO TABS: 10.0000 mg | ORAL_TABLET | Freq: Every day | ORAL | Status: DC

## 2014-07-11 NOTE — Telephone Encounter (Signed)
Spoke with the pt  He has not improved that much  Still coughing a lot if outside, and he works outside every day  [facility]air [facility] to [facility]

## 2014-07-11 NOTE — Telephone Encounter (Signed)
LMTCB

## 2014-09-10 ENCOUNTER — Ambulatory Visit (INDEPENDENT_AMBULATORY_CARE_PROVIDER_SITE_OTHER): Payer: BLUE CROSS/BLUE SHIELD | Admitting: Internal Medicine

## 2014-09-10 ENCOUNTER — Encounter: Payer: Self-pay | Admitting: Internal Medicine

## 2014-09-10 VITALS — BP 130/74 | HR 74 | Ht 64.0 in | Wt 190.0 lb

## 2014-09-10 DIAGNOSIS — J45991 Cough variant asthma: Secondary | ICD-10-CM

## 2014-09-10 NOTE — Progress Notes (Signed)
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    Allergy eval neg by ENT .> no change with gerd rx or neurontin.  Itching/ sneezing spring and fall  Better p zyrtec takes daily plus flonase if misses dose nose gets stuffed up. Does leave off zyrtec in summer s flare in symptoms rec Cyclical cough rx> did not complete    10/18/2013 f/u ov/Wert re: yearly cough oct -April x 5 year  Chief Complaint  Patient presents with  . Follow-up    nonprod cough still present but much improved.  Managed by gum/sugarless candies.  maint on  Zyrtec each am Most tramadol he took was one per day If stays home no cough so no reason to take tramadol - turns out only coughs if goes out in cold.    Kouffman Reflux v Neurogenic Cough Differentiator Reflux Comments  Do you awaken from a sound sleep coughing violently?                            With trouble breathing? no   Do you have choking episodes when you cannot  Get enough air, gasping for air ?              no   Do you usually cough when you lie down into  The bed, or when you just lie down to rest ?                          no   Do you usually cough after meals or eating?         no   Do you cough when (or after) you bend over?    no   GERD SCORE     Kouffman Reflux v Neurogenic Cough Differentiator Neurogenic   Do you more-or-less cough all day long? yes   Does change of temperature make you cough? yes    Does laughing or chuckling cause you to cough? sometimes   Do fumes (perfume, automobile fumes, burned  Toast, etc.,) cause you to cough ?      sometime   Does speaking, singing, or talking on the phone cause you to cough   ?               Yes    Neurogenic/Airway score      rec MCT > pos at 4.0/ reproduced all his symptoms at 90%, better again p saba   10/27/2013 f/u ov/Wert re: rx for cough variant asthma Chief Complaint  Patient presents with  . Follow-up    Cough is unchanged since his last visit.   rec symbicort 80 Take 2 puffs first thing in am and then another 2 puffs about 12 hours later ( when better stop the evening dose - if  not better return) If doing great after a few weeks, ok to stop acid suppression   02/27/2014 f/u ov/Wert re: cough variant asthma/ one flare to ER 12/31/13 did not activate gerd plan Chief Complaint  Patient presents with  . Follow-up    Pt states that his cough is some better since last visit. No new co's today.   has ventolin/spacer not using it. Mostly dry daytime cough rec Work on inhaler technique: ne As long as any cough at all > Pantoprazole (protonix) 40 mg   Take 30-60 min before first meal of the day and Pepcid 20 mg one bedtime   Only use your albuterol(ventolin/proair)    06/26/2014 f/u ov/Wert re: maint on protonix / pepcid / symbicort 80  Chief Complaint  Patient presents with  . Acute Visit    c/o cough x 2-3 days, stays hoarse all the time,works outside,sob with exertion,no wheezing,denies fcs,no cp or tightness,pnd,sorethroat from coughing  typically no noct cough, more daytime   rec Prednisone 10 mg take  4 each am x 2 days,   2 each am x 2 days,  1 each am x 2 days and stop   Take delsym two tsp every 12 hours and supplement if needed with  tramadol 50 mg up to 2 every 4 hours   Once you have eliminated the cough for 3 straight days try reducing the tramadol first   06/29/14 added singulair as no better     09/10/2014 f/u  ov/Wert re: cough variant asthma/ now on singulair and symbiocrt 80 2bid s need for cough suppression or saba  Chief Complaint  Patient presents with  . Follow-up    Pt states that his breathing is doing well. Cough seems to be worse when the weather is cooler.       No obvious other patterns in  day to day or daytime variability or cp or chest tightness, subjective wheeze overt sinus or hb symptoms. No unusual exp hx or h/o childhood pna/ asthma or knowledge of premature birth.  Sleeping ok without nocturnal  or early am exacerbation  of respiratory  c/o's or need for noct saba. Also denies any obvious fluctuation of symptoms with weather or environmental changes or other aggravating or alleviating factors except as outlined above   Current Medications, Allergies, Complete Past Medical History, Past Surgical History, Family History, and Social History were reviewed in [employer] [facility] record.  ROS  The following are not active complaints unless bolded sore throat, dysphagia, dental problems, itching, sneezing,  nasal congestion or excess/ purulent secretions, ear ache,   fever, chills, sweats, unintended wt loss, pleuritic or exertional cp, hemoptysis,  orthopnea pnd or leg swelling, presyncope, palpitations, heartburn, abdominal pain, anorexia, nausea, vomiting, diarrhea  or change in bowel or urinary habits, change in stools or urine, dysuria,hematuria,  rash, arthralgias, visual complaints, headache, numbness weakness or ataxia or problems with walking or coordination,  change in mood/affect or memory.         Objective:   Physical Exam  amb wm nad   10/18/2013        195 > 10/27/2013 193 > 189 02/27/2014 > 06/26/2014 189 > 09/10/2014 190  Wt Readings from Last 3 Encounters:  09/20/13 197 lb 3.2 oz (89.449 kg)     HEENT: nl dentition, turbinates, and orophanx. Nl external ear canals without cough reflex   NECK :  without JVD/Nodes/TM/ nl carotid upstrokes  bilaterally   LUNGS: no acc muscle use, clear  now to a and p     CV:  RRR  no s3 or murmur or increase in P2, no edema   ABD:  soft and nontender with nl excursion in the supine position. No bruits or organomegaly, bowel sounds nl  MS:  warm without deformities, calf tenderness, cyanosis or clubbing  SKIN: warm and dry without lesions          CXR  09/20/2013 : Vague density in the region of the right middle lobe likely correlating with nodular foci described on prior CT (07/07/10)reflecting subpleural lymph nodes. No further evidence of acute cardiopulmonary disease     Assessment & Plan:

## 2014-09-10 NOTE — Assessment & Plan Note (Signed)
-  MCT 10/24/13 POS at 4.0  - rx symbicort 80 2bid 10/27/13  - 02/27/2014 p extensive coaching HFA effectiveness =   90%  - Allergy profile 06/26/14 > Neg Eos/  Ig E 59, min RAST pos dust and grass - added singulair 06/29/14 > improved at f/u 09/10/2014   All goals of chronic asthma control met including optimal function and elimination of symptoms with minimal need for rescue therapy.  Contingencies discussed in full including contacting this office immediately if not controlling the symptoms using the rule of two's.     No change in rx needed    Each maintenance medication was reviewed in detail including most importantly the difference between maintenance and as needed and under what circumstances the prns are to be used.  Please see instructions for details which were reviewed in writing and the patient given a copy.

## 2014-09-10 NOTE — Patient Instructions (Signed)
No change medications  At the first sign of a flare in cough for any reason please restart protonix (pantoprazole) Take 30-60 min before first meal of the day until cough gone for a week   Please schedule a follow up visit in 3 months but call sooner if needed

## 2014-10-01 ENCOUNTER — Telehealth: Payer: Self-pay | Admitting: Internal Medicine

## 2014-10-01 NOTE — Telephone Encounter (Signed)
LMTCB

## 2014-10-02 MED ORDER — MONTELUKAST SODIUM 10 MG PO TABS: 10.0000 mg | ORAL_TABLET | Freq: Every day | ORAL | Status: DC

## 2014-10-02 NOTE — Telephone Encounter (Signed)
[phone] returning call

## 2014-10-02 NOTE — Telephone Encounter (Signed)
Spoke with pt, needs singulair refill.  Nothing further needed.

## 2014-11-21 ENCOUNTER — Telehealth: Payer: Self-pay | Admitting: Internal Medicine

## 2014-11-21 MED ORDER — BUDESONIDE-FORMOTEROL FUMARATE 80-4.5 MCG/ACT IN AERO: INHALATION_SPRAY | RESPIRATORY_TRACT | Status: DC

## 2014-11-21 NOTE — Telephone Encounter (Signed)
Rx sent to pharmacy. Patient notified. Nothing further needed.  

## 2014-12-17 ENCOUNTER — Encounter (INDEPENDENT_AMBULATORY_CARE_PROVIDER_SITE_OTHER): Payer: Self-pay

## 2014-12-17 ENCOUNTER — Encounter: Payer: Self-pay | Admitting: Internal Medicine

## 2014-12-17 ENCOUNTER — Ambulatory Visit (INDEPENDENT_AMBULATORY_CARE_PROVIDER_SITE_OTHER): Payer: BLUE CROSS/BLUE SHIELD | Admitting: Internal Medicine

## 2014-12-17 VITALS — BP 118/84 | HR 101 | Ht 64.0 in | Wt 191.0 lb

## 2014-12-17 DIAGNOSIS — J45991 Cough variant asthma: Secondary | ICD-10-CM

## 2014-12-17 MED ORDER — BUDESONIDE-FORMOTEROL FUMARATE 80-4.5 MCG/ACT IN AERO: INHALATION_SPRAY | RESPIRATORY_TRACT | Status: DC

## 2014-12-17 MED ORDER — MONTELUKAST SODIUM 10 MG PO TABS: 10.0000 mg | ORAL_TABLET | Freq: Every day | ORAL | Status: DC

## 2014-12-17 NOTE — Patient Instructions (Signed)
No change in your meds   If you are satisfied with your treatment plan,  let your doctor know and he/she can either refill your medications or you can return here when your prescription runs out.     If in any way you are not 100% satisfied,  please tell us.  If 100% better, tell your friends!  Pulmonary follow up is as needed

## 2014-12-17 NOTE — Progress Notes (Signed)
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    Allergy eval neg by ENT .> no change with gerd rx or neurontin.  Itching/ sneezing spring and fall  Better p zyrtec takes daily plus flonase if misses dose nose gets stuffed up. Does leave off zyrtec in summer s flare in symptoms rec Cyclical cough rx> did not complete    10/18/2013 f/u ov/Antonio Holmes re: yearly cough oct -April x 5 year  Chief Complaint  Patient presents with  . Follow-up    nonprod cough still present but much improved.  Managed by gum/sugarless candies.  maint on  Zyrtec each am Most tramadol he took was one per day If stays home no cough so no reason to take tramadol - turns out only coughs if goes out in cold.    Kouffman Reflux v Neurogenic Cough Differentiator Reflux Comments  Do you awaken from a sound sleep coughing violently?                            With trouble breathing? no   Do you have choking episodes when you cannot  Get enough air, gasping for air ?              no   Do you usually cough when you lie down into  The bed, or when you just lie down to rest ?                          no   Do you usually cough after meals or eating?         no   Do you cough when (or after) you bend over?    no   GERD SCORE     Kouffman Reflux v Neurogenic Cough Differentiator Neurogenic   Do you more-or-less cough all day long? yes   Does change of temperature make you cough? yes    Does laughing or chuckling cause you to cough? sometimes   Do fumes (perfume, automobile fumes, burned  Toast, etc.,) cause you to cough ?      sometime   Does speaking, singing, or talking on the phone cause you to cough   ?               Yes    Neurogenic/Airway score      rec MCT > pos at 4.0/ reproduced all his symptoms at 90%, better again p saba   10/27/2013 f/u ov/Antonio Holmes re: rx for cough variant asthma Chief Complaint  Patient presents with  . Follow-up    Cough is unchanged since his last visit.   rec symbicort 80 Take 2 puffs first thing in am and then another 2 puffs about 12 hours later ( when better stop the evening dose - if  not better return) If doing great after a few weeks, ok to stop acid suppression   02/27/2014 f/u ov/Antonio Holmes re: cough variant asthma/ one flare to ER 12/31/13 did not activate gerd plan Chief Complaint  Patient presents with  . Follow-up    Pt states that his cough is some better since last visit. No new co's today.   has ventolin/spacer not using it. Mostly dry daytime cough rec Work on inhaler technique: ne As long as any cough at all > Pantoprazole (protonix) 40 mg   Take 30-60 min before first meal of the day and Pepcid 20 mg one bedtime   Only use your albuterol(ventolin/proair)    06/26/2014 f/u ov/[name] re: maint on protonix / pepcid / symbicort 80  Chief Complaint  Patient presents with  . Acute Visit    c/o cough x 2-3 days, stays hoarse all the time,works outside,sob with exertion,no wheezing,denies fcs,no cp or tightness,pnd,sorethroat from coughing  typically no noct cough, more daytime   rec Prednisone 10 mg take  4 each am x 2 days,   2 each am x 2 days,  1 each am x 2 days and stop  Take delsym two tsp every 12 hours and supplement if needed with  tramadol 50 mg up to 2 every 4 hours   Once you have eliminated the cough for 3 straight days try reducing the tramadol first   06/29/14 added singulair as no better     09/10/2014 f/u  ov/Antonio Holmes re: cough variant asthma/ now on singulair and symbiocrt 80 2bid s need for cough suppression or saba  Chief Complaint  Patient presents with  . Follow-up    Pt states that his breathing is doing well. Cough seems to be worse when the weather is cooler.    rec  No change medications At the first sign of a flare in cough for any reason please restart protonix (pantoprazole) Take 30-60 min before first meal of the day until cough gone for a week     12/17/2014 f/u ov/Antonio Holmes re: cough varian asthma maint on symb 80 / pepcid / singulair Chief Complaint  Patient presents with  . Follow-up    Pt states his coughing is doing well. He does better with the warmer weather. He is using albuterol on average once every 2 wks.   Not limited by breathing from desired activities      No obvious other patterns in  day to day or daytime variability or cp or chest tightness, subjective wheeze overt sinus or hb symptoms. No unusual exp hx or h/o childhood pna/ asthma or knowledge of premature birth.  Sleeping ok without nocturnal  or early am exacerbation  of respiratory  c/o's or need for noct saba. Also denies any obvious fluctuation of symptoms with weather or environmental changes or other aggravating or alleviating factors except as outlined above   Current Medications, Allergies, Complete Past Medical History, Past Surgical History, Family History, and Social History were reviewed in [employer] [facility] record.  ROS  The following are not active complaints unless bolded sore throat, dysphagia, dental problems, itching, sneezing,  nasal congestion or excess/ purulent secretions, ear ache,   fever, chills, sweats, unintended wt loss, pleuritic or exertional cp, hemoptysis,  orthopnea pnd or leg swelling, presyncope, palpitations, heartburn, abdominal pain, anorexia, nausea, vomiting, diarrhea  or change in bowel or urinary habits, change in stools or urine, dysuria,hematuria,  rash,  arthralgias, visual complaints, headache, numbness weakness or ataxia or problems  with walking or coordination,  change in mood/affect or memory.         Objective:   Physical Exam  amb wm nad   10/18/2013        195 > 10/27/2013 193 > 189 02/27/2014 > 06/26/2014 189 > 09/10/2014 190 >  12/17/2014 191  Wt Readings from Last 3 Encounters:  09/20/13 197 lb 3.2 oz (89.449 kg)     HEENT: nl dentition, turbinates, and orophanx. Nl external ear canals without cough reflex   NECK :  without JVD/Nodes/TM/ nl carotid upstrokes bilaterally   LUNGS: no acc muscle use, clear   to a and p     CV:  RRR  no s3 or murmur or increase in P2, no edema   ABD:  soft and nontender with nl excursion in the supine position. No bruits or organomegaly, bowel sounds nl  MS:  warm without deformities, calf tenderness, cyanosis or clubbing  SKIN: warm and dry without lesions          CXR  09/20/2013 : Vague density in the region of the right middle lobe likely correlating with nodular foci described on prior CT (07/07/10)reflecting subpleural lymph nodes. No further evidence of acute cardiopulmonary disease     Assessment & Plan:   Outpatient Encounter Prescriptions as of 12/17/2014  Medication Sig  . acetaminophen (TYLENOL) 325 MG tablet Take 650 mg by mouth every 6 (six) hours as needed.  Marland Kitchen. albuterol (PROAIR HFA) 108 (90 BASE) MCG/ACT inhaler 2 puffs every 4 hours as needed only  if your can't catch your breath  . budesonide-formoterol (SYMBICORT) 80-4.5 MCG/ACT inhaler Take 2 puffs first thing in am and then another 2 puffs about 12 hours later.  . cetirizine (ZYRTEC) 10 MG tablet Take 10 mg by mouth daily as needed.   . famotidine (PEPCID) 20 MG tablet One at bedtime  . fluticasone (FLONASE) 50 MCG/ACT nasal spray Place 1 spray into both nostrils as needed.   . montelukast (SINGULAIR) 10 MG tablet Take 1 tablet (10 mg total) by mouth at bedtime.  . Multiple Vitamin (MULTIVITAMIN) capsule Take 1  capsule by mouth daily.  . [DISCONTINUED] budesonide-formoterol (SYMBICORT) 80-4.5 MCG/ACT inhaler Take 2 puffs first thing in am and then another 2 puffs about 12 hours later.  . [DISCONTINUED] montelukast (SINGULAIR) 10 MG tablet Take 1 tablet (10 mg total) by mouth at bedtime.

## 2014-12-18 ENCOUNTER — Encounter: Payer: Self-pay | Admitting: Internal Medicine

## 2014-12-18 NOTE — Assessment & Plan Note (Signed)
-  MCT 10/24/13 POS at 4.0  - rx symbicort 80 2bid 10/27/13  - 02/27/2014 p extensive coaching HFA effectiveness =   90%  - Allergy profile 06/26/14 > Neg Eos/  Ig E 59, min RAST pos dust and grass - added singulair 06/29/14 > improved at f/u 09/10/2014  - changed f/u to prn 12/17/14    All goals of chronic asthma control met including optimal function and elimination of symptoms with minimal need for rescue therapy.  Contingencies discussed in full including contacting this office immediately if not controlling the symptoms using the rule of two's.     Pulmonary f/u is prn

## 2016-02-17 ENCOUNTER — Ambulatory Visit (INDEPENDENT_AMBULATORY_CARE_PROVIDER_SITE_OTHER): Payer: BLUE CROSS/BLUE SHIELD | Admitting: Allergy and Immunology

## 2016-02-17 ENCOUNTER — Encounter: Payer: Self-pay | Admitting: Allergy and Immunology

## 2016-02-17 VITALS — BP 110/80 | HR 80 | Temp 97.5°F | Resp 16 | Ht 63.58 in | Wt 187.6 lb

## 2016-02-17 DIAGNOSIS — J4540 Moderate persistent asthma, uncomplicated: Secondary | ICD-10-CM

## 2016-02-17 DIAGNOSIS — Z8719 Personal history of other diseases of the digestive system: Secondary | ICD-10-CM | POA: Insufficient documentation

## 2016-02-17 DIAGNOSIS — K219 Gastro-esophageal reflux disease without esophagitis: Secondary | ICD-10-CM

## 2016-02-17 DIAGNOSIS — J3089 Other allergic rhinitis: Secondary | ICD-10-CM

## 2016-02-17 DIAGNOSIS — J302 Other seasonal allergic rhinitis: Secondary | ICD-10-CM | POA: Insufficient documentation

## 2016-02-17 DIAGNOSIS — R053 Chronic cough: Secondary | ICD-10-CM

## 2016-02-17 DIAGNOSIS — R05 Cough: Secondary | ICD-10-CM | POA: Diagnosis not present

## 2016-02-17 MED ORDER — FLUTICASONE PROPIONATE 50 MCG/ACT NA SUSP: 2.0000 | Freq: Every day | NASAL | Status: DC

## 2016-02-17 MED ORDER — AZELASTINE HCL 0.15 % NA SOLN: 2.0000 | Freq: Two times a day (BID) | NASAL | Status: DC

## 2016-02-17 MED ORDER — EPINEPHRINE 0.3 MG/0.3ML IJ SOAJ: 0.3000 mg | Freq: Once | INTRAMUSCULAR | Status: DC

## 2016-02-17 NOTE — Assessment & Plan Note (Signed)
   Aeroallergen avoidance measures have been discussed and provided in written form.  A prescription has been provided for levocetirizine, 5 mg daily as needed.  A sample and prescription have been provided for Dymista (azelastine/fluticasone) nasal spray, 1 spray per nostril twice daily as needed. Proper nasal spray technique has been discussed and demonstrated.  I have also recommended nasal saline spray (i.e., Simply Saline) or nasal saline lavage (i.e., NeilMed) as needed prior to medicated nasal sprays.  The risks and benefits of aeroallergen immunotherapy have been discussed. The patient is motivated to initiate immunotherapy if insurance coverage is favorable. He will let us know how he would like to proceed.

## 2016-02-17 NOTE — Progress Notes (Addendum)
New Patient Note  RE: Antonio Holmes MRN: [id number] DOB: 05/25/1961 Date of Office Visit: 02/17/2016  Referring provider: Keturah Barrerossley, James J, MD Primary care provider: Gweneth DimitriMCNEILL,WENDY, MD  Chief Complaint: Nasal Congestion and Cough   History of present illness: HPI Comments: Antonio HawkingDavid Holmes is a 55 y.o. male presenting today for consultation of rhinitis and coughing.  He complains of nasal congestion, rhinorrhea, thick postnasal drainage, hoarseness, sneezing, ocular pruritus, and occasional sinus pressure over the forehead and cheekbones. These symptoms occur year around but are more severe in the springtime and fall. The hoarseness seemed to increase significantly in mid-late February this year. He works at an outdoor sawmill and notes that his symptoms seem to be worse in that environment but he is unsure if it is due to sawdust or pollen exposure.  He reports that he coughs "all the time", particularly when talking and when assuming the recumbent position at bedtime.  He was diagnosed with asthma approximately 2 years ago.  He has experienced moderate reduction of lower respiratory symptoms with Symbicort 80/4.5 g, 2 inhalations twice a day.  He does not use a spacer device with HFA inhalers.  He is diagnosed with gastroesophageal reflux by his otolaryngologist, Dr. Haroldine Lawsrossley, and started on Nexium with moderate symptom reduction.   Assessment and plan: Perennial and seasonal allergic rhinitis  Aeroallergen avoidance measures have been discussed and provided in written form.  A prescription has been provided for levocetirizine, 5 mg daily as needed.  A sample and prescription have been provided for Dymista (azelastine/fluticasone) nasal spray, 1 spray per nostril twice daily as needed. Proper nasal spray technique has been discussed and demonstrated.  I have also recommended nasal saline spray (i.e., Simply Saline) or nasal saline lavage (i.e., NeilMed) as needed prior to medicated  nasal sprays.  The risks and benefits of aeroallergen immunotherapy have been discussed. The patient is motivated to initiate immunotherapy if insurance coverage is favorable. He will let us know how he would like to proceed.  Moderate persistent asthma  For now continue Symbicort 80/4.5 g, 2 inhalations twice a day, and albuterol every 4-6 hours as needed.  To maximize pulmonary deposition, a spacer has been provided along with instructions for its proper administration with an HFA inhaler.  Subjective and objective measures of pulmonary function will be followed and the treatment plan will be adjusted accordingly.  GERD (gastroesophageal reflux disease)  Appropriate reflux lifestyle modifications have been provided.  Continue esomeprazole as previously prescribed.  Persistent cough The most common causes of chronic cough include the following: upper airway cough syndrome (UACS) which is caused by variety of rhinosinus conditions; asthma; gastroesophageal reflux disease (GERD); chronic bronchitis from cigarette smoking or other inhaled environmental irritants; non-asthmatic eosinophilic bronchitis; and bronchiectasis. In prospective studies, these conditions have accounted for up to 94% of the causes of chronic cough in immunocompetent adults. The history and physical examination suggest that his cough is multifactorial with contribution from postnasal drainage, bronchial hyperresponsiveness, and acid reflux. We will address these issues at this time.   Treatment plan as outlined above.    Meds ordered this encounter  Medications  . Azelastine HCl 0.15 % SOLN    Sig: Place 2 sprays into both nostrils 2 (two) times daily.    Dispense:  30 mL    Refill:  5  . fluticasone (FLONASE) 50 MCG/ACT nasal spray    Sig: Place 2 sprays into both nostrils daily.    Dispense:  16 g  Refill:  5  . EPINEPHrine 0.3 mg/0.3 mL IJ SOAJ injection    Sig: Inject 0.3 mLs (0.3 mg total) into the  muscle once.    Dispense:  2 Device    Refill:  1    Diagnositics: Spirometry:  Normal with an FEV1 of 108% predicted.  Please see scanned spirometry results for details. Environmental epicutaneous and intradermal testing: Positive to grass pollens, weed pollens, ragweed pollen, tree pollens, molds, cat hair, dog epithelia, cockroach antigen, and dust mite antigen.    Physical examination: Blood pressure 110/80, pulse 80, temperature 97.5 F (36.4 C), temperature source Oral, resp. rate 16, height 5' 3.58" (1.615 m), weight 187 lb 9.6 oz (85.095 kg).  General: Alert, interactive, in no acute distress. HEENT: TMs pearly gray, turbinates edematous with clear discharge, post-pharynx erythematous. Neck: Supple without lymphadenopathy. Lungs: Clear to auscultation without wheezing, rhonchi or rales. CV: Normal S1, S2 without murmurs. Abdomen: Nondistended, nontender. Skin: Warm and dry, without lesions or rashes. Extremities:  No clubbing, cyanosis or edema. Neuro:   Grossly intact.  Review of systems:  Review of Systems  Constitutional: Negative for fever, chills and weight loss.  HENT: Positive for congestion. Negative for nosebleeds.   Eyes: Negative for blurred vision.  Respiratory: Positive for cough, sputum production and shortness of breath. Negative for hemoptysis.   Cardiovascular: Negative for chest pain.  Gastrointestinal: Negative for diarrhea and constipation.  Genitourinary: Negative for dysuria.  Musculoskeletal: Negative for myalgias and joint pain.  Skin: Negative for itching and rash.  Neurological: Positive for headaches. Negative for dizziness.  Endo/Heme/Allergies: Positive for environmental allergies. Does not bruise/bleed easily.    Past medical history:  Past Medical History  Diagnosis Date  . Asthma   . Cough     Past surgical history:  Past Surgical History  Procedure Laterality Date  . Knee surgery    . Wisdom tooth extraction      Family  history: Family History  Problem Relation Age of Onset  . Hypertension Father   . Allergic rhinitis Neg Hx   . Angioedema Neg Hx   . Asthma Neg Hx   . Atopy Neg Hx   . Eczema Neg Hx   . Immunodeficiency Neg Hx   . Urticaria Neg Hx     Social history: Social History   Social History  . Marital Status: Married    Spouse Name: N/A  . Number of Children: N/A  . Years of Education: N/A   Occupational History  . 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 Smoking status: Former Smoker -- 1.00 packs/day for .5 years    Types: Cigarettes    Quit date: 08/24/1992  . Smokeless tobacco: Never Used  . Alcohol Use: No  . Drug Use: No  . Sexual Activity: Not on file   Other Topics Concern  . Not on file   Social History Narrative   Environmental History: The patient lives in a 56 year old house with hardwood floors throughout and central air/heat.  There are dogs and a cat in the house which do not have access to his bedroom.  He is a former cigarette smoker having quit in 1993.    Medication List       This list is accurate as of: 02/17/16  7:23 PM.  Always use your most recent med list.               acetaminophen 325 MG tablet  Commonly known as:  TYLENOL  Take 650 mg by mouth every 6 (six) hours as needed.     albuterol 108 (90 Base) MCG/ACT inhaler  Commonly known as:  PROAIR HFA  2 puffs every 4 hours as needed only  if your can't catch your breath     Azelastine HCl 0.15 % Soln  Place 2 sprays into both nostrils 2 (two) times daily.     budesonide-formoterol 80-4.5 MCG/ACT inhaler  Commonly known as:  SYMBICORT  Take 2 puffs first thing in am and then another 2 puffs about 12 hours later.     cetirizine 10 MG tablet  Commonly known as:  ZYRTEC  Take 10 mg by mouth daily as needed.     EPINEPHrine 0.3 mg/0.3 mL Soaj injection  Commonly known as:  EPI-PEN     EPINEPHrine 0.3 mg/0.3 mL Soaj injection  Commonly known as:  EPI-PEN  Inject 0.3 mLs  (0.3 mg total) into the muscle once.     famotidine 20 MG tablet  Commonly known as:  PEPCID  One at bedtime     fluticasone 50 MCG/ACT nasal spray  Commonly known as:  FLONASE  Place 1 spray into both nostrils as needed.     fluticasone 50 MCG/ACT nasal spray  Commonly known as:  FLONASE  Place 2 sprays into both nostrils daily.     montelukast 10 MG tablet  Commonly known as:  SINGULAIR  Take 1 tablet (10 mg total) by mouth at bedtime.     multivitamin capsule  Take 1 capsule by mouth daily.        Known medication allergies: Allergies  Allergen Reactions  . Tramadol     "made my skin crawl"    I appreciate the opportunity to take part in Antonio Holmes's care. Please do not hesitate to contact me with questions.  Sincerely,   R. Jorene Guest, MD

## 2016-02-17 NOTE — Patient Instructions (Addendum)
Perennial and seasonal allergic rhinitis  Aeroallergen avoidance measures have been discussed and provided in written form.  A prescription has been provided for levocetirizine, 5 mg daily as needed.  A sample and prescription have been provided for Dymista (azelastine/fluticasone) nasal spray, 1 spray per nostril twice daily as needed. Proper nasal spray technique has been discussed and demonstrated.  I have also recommended nasal saline spray (i.e., Simply Saline) or nasal saline lavage (i.e., NeilMed) as needed prior to medicated nasal sprays.  The risks and benefits of aeroallergen immunotherapy have been discussed. The patient is motivated to initiate immunotherapy if insurance coverage is favorable. He will let us know how he would like to proceed.  Moderate persistent asthma  For now continue Symbicort 80/4.5 g, 2 inhalations twice a day, and albuterol every 4-6 hours as needed.  To maximize pulmonary deposition, a spacer has been provided along with instructions for its proper administration with an HFA inhaler.  Subjective and objective measures of pulmonary function will be followed and the treatment plan will be adjusted accordingly.  GERD (gastroesophageal reflux disease)  Appropriate reflux lifestyle modifications have been provided.  Continue esomeprazole as previously prescribed.  Persistent cough The most common causes of chronic cough include the following: upper airway cough syndrome (UACS) which is caused by variety of rhinosinus conditions; asthma; gastroesophageal reflux disease (GERD); chronic bronchitis from cigarette smoking or other inhaled environmental irritants; non-asthmatic eosinophilic bronchitis; and bronchiectasis. In prospective studies, these conditions have accounted for up to 94% of the causes of chronic cough in immunocompetent adults. The history and physical examination suggest that his cough is multifactorial with contribution from postnasal  drainage, bronchial hyperresponsiveness, and acid reflux. We will address these issues at this time.   Treatment plan as outlined above.    Return in about 4 months (around 06/18/2016), or if symptoms worsen or fail to improve.  Reducing Pollen Exposure  The American Academy of Allergy, Asthma and Immunology suggests the following steps to reduce your exposure to pollen during allergy seasons.    1. Do not hang sheets or clothing out to dry; pollen may collect on these items. 2. Do not mow lawns or spend time around freshly cut grass; mowing stirs up pollen. 3. Keep windows closed at night.  Keep car windows closed while driving. 4. Minimize morning activities outdoors, a time when pollen counts are usually at their highest. 5. Stay indoors as much as possible when pollen counts or humidity is high and on windy days when pollen tends to remain in the air longer. 6. Use air conditioning when possible.  Many air conditioners have filters that trap the pollen spores. 7. Use a HEPA room air filter to remove pollen form the indoor air you breathe.   Control of House Dust Mite Allergen  House dust mites play a major role in allergic asthma and rhinitis.  They occur in environments with high humidity wherever human skin, the food for dust mites is found. High levels have been detected in dust obtained from mattresses, pillows, carpets, upholstered furniture, bed covers, clothes and soft toys.  The principal allergen of the house dust mite is found in its feces.  A gram of dust may contain 1,000 mites and 250,000 fecal particles.  Mite antigen is easily measured in the air during house cleaning activities.    1. Encase mattresses, including the box spring, and pillow, in an air tight cover.  Seal the zipper end of the encased mattresses with wide adhesive tape. 2.  Wash the bedding in water of 130 degrees Farenheit weekly.  Avoid cotton comforters/quilts and flannel bedding: the most ideal bed  covering is the dacron comforter. 3. Remove all upholstered furniture from the bedroom. 4. Remove carpets, carpet padding, rugs, and non-washable window drapes from the bedroom.  Wash drapes weekly or use plastic window coverings. 5. Remove all non-washable stuffed toys from the bedroom.  Wash stuffed toys weekly. 6. Have the room cleaned frequently with a vacuum cleaner and a damp dust-mop.  The patient should not be in a room which is being cleaned and should wait 1 hour after cleaning before going into the room. 7. Close and seal all heating outlets in the bedroom.  Otherwise, the room will become filled with dust-laden air.  An electric heater can be used to heat the room. Reduce indoor humidity to less than 50%.  Do not use a humidifier.  Control of Dog or Cat Allergen  Avoidance is the best way to manage a dog or cat allergy. If you have a dog or cat and are allergic to dog or cats, consider removing the dog or cat from the home. If you have a dog or cat but don't want to find it a new home, or if your family wants a pet even though someone in the household is allergic, here are some strategies that may help keep symptoms at bay:  1. Keep the pet out of your bedroom and restrict it to only a few rooms. Be advised that keeping the dog or cat in only one room will not limit the allergens to that room. 2. Don't pet, hug or kiss the dog or cat; if you do, wash your hands with soap and water. 3. High-efficiency particulate air (HEPA) cleaners run continuously in a bedroom or living room can reduce allergen levels over time. 4. Regular use of a high-efficiency vacuum cleaner or a central vacuum can reduce allergen levels. 5. Giving your dog or cat a bath at least once a week can reduce airborne allergen.  Control of Mold Allergen  Mold and fungi can grow on a variety of surfaces provided certain temperature and moisture conditions exist.  Outdoor molds grow on plants, decaying vegetation and  soil.  The major outdoor mold, Alternaria and Cladosporium, are found in very high numbers during hot and dry conditions.  Generally, a late Summer - Fall peak is seen for common outdoor fungal spores.  Rain will temporarily lower outdoor mold spore count, but counts rise rapidly when the rainy period ends.  The most important indoor molds are Aspergillus and Penicillium.  Dark, humid and poorly ventilated basements are ideal sites for mold growth.  The next most common sites of mold growth are the bathroom and the kitchen.  Outdoor Microsoft[employer]old Control 5. Use air conditioning and keep windows closed 6. Avoid exposure to decaying vegetation. 7. Avoid leaf raking. 8. Avoid grain handling. 9. Consider wearing a face mask if working in moldy areas.  Indoor Mold Control 1. Maintain humidity below 50%. 2. Clean washable surfaces with 5% bleach solution. 3. Remove sources e.g. Contaminated carpets.  Control of Cockroach Allergen  Cockroach allergen has been identified as an important cause of acute attacks of asthma, especially in urban settings.  There are fifty-five species of cockroach that exist in the [place], however only three, the [place], [place] and Oriental species produce allergen that can affect patients with Asthma.  Allergens can be obtained from fecal particles, egg casings and secretions from  cockroaches.    1. Remove food sources. 2. Reduce access to water. 3. Seal access and entry points. 4. Spray runways with 0.5-1% Diazinon or Chlorpyrifos 5. Blow boric acid power under stoves and refrigerator. 6. Place bait stations (hydramethylnon) at feeding sites.

## 2016-02-17 NOTE — Assessment & Plan Note (Signed)
   Appropriate reflux lifestyle modifications have been provided.  Continue esomeprazole as previously prescribed.

## 2016-02-17 NOTE — Assessment & Plan Note (Signed)
The most common causes of chronic cough include the following: upper airway cough syndrome (UACS) which is caused by variety of rhinosinus conditions; asthma; gastroesophageal reflux disease (GERD); chronic bronchitis from cigarette smoking or other inhaled environmental irritants; non-asthmatic eosinophilic bronchitis; and bronchiectasis. In prospective studies, these conditions have accounted for up to 94% of the causes of chronic cough in immunocompetent adults. The history and physical examination suggest that his cough is multifactorial with contribution from postnasal drainage, bronchial hyperresponsiveness, and acid reflux. We will address these issues at this time.   Treatment plan as outlined above.

## 2016-02-17 NOTE — Assessment & Plan Note (Signed)
   For now continue Symbicort 80/4.5 g, 2 inhalations twice a day, and albuterol every 4-6 hours as needed.  To maximize pulmonary deposition, a spacer has been provided along with instructions for its proper administration with an HFA inhaler.  Subjective and objective measures of pulmonary function will be followed and the treatment plan will be adjusted accordingly.

## 2016-03-17 ENCOUNTER — Telehealth: Payer: Self-pay | Admitting: *Deleted

## 2016-03-17 NOTE — Telephone Encounter (Signed)
Patient would like to start injection. Scheduled to start injections next Tuesday August 1 in [place]. Please send scripts.

## 2016-03-17 NOTE — Addendum Note (Signed)
Addended by: Candis Schatz C on: 03/17/2016 01:17 PM   Modules accepted: Orders

## 2016-03-17 NOTE — Telephone Encounter (Signed)
ITx orders completed and submitted.

## 2016-03-18 DIAGNOSIS — J3089 Other allergic rhinitis: Secondary | ICD-10-CM | POA: Diagnosis not present

## 2016-03-19 DIAGNOSIS — J301 Allergic rhinitis due to pollen: Secondary | ICD-10-CM | POA: Diagnosis not present

## 2016-03-31 ENCOUNTER — Ambulatory Visit (INDEPENDENT_AMBULATORY_CARE_PROVIDER_SITE_OTHER): Payer: BLUE CROSS/BLUE SHIELD | Admitting: *Deleted

## 2016-03-31 DIAGNOSIS — J309 Allergic rhinitis, unspecified: Secondary | ICD-10-CM

## 2016-04-07 ENCOUNTER — Ambulatory Visit (INDEPENDENT_AMBULATORY_CARE_PROVIDER_SITE_OTHER): Payer: BLUE CROSS/BLUE SHIELD

## 2016-04-07 DIAGNOSIS — J309 Allergic rhinitis, unspecified: Secondary | ICD-10-CM | POA: Diagnosis not present

## 2016-04-14 ENCOUNTER — Ambulatory Visit (INDEPENDENT_AMBULATORY_CARE_PROVIDER_SITE_OTHER): Payer: BLUE CROSS/BLUE SHIELD

## 2016-04-14 DIAGNOSIS — J309 Allergic rhinitis, unspecified: Secondary | ICD-10-CM

## 2016-04-20 NOTE — Progress Notes (Signed)
Antonio Hawkingavid Castleman DOB: Aug 01, 1961  Patient started allergy injections on 03/31/2016.  Patient started Blue Vial 1/100,000 1-Mold-DMite-Cat-Dog-CR and 1-Grass-Weed-Tree at 0.05 dosage each.  Patient has signed consent.  Instructions and side effects were reviewed with patient.  Patient to follow Schedule A and to return 1-2 times weekly.  Patient does have Epi-Pen and has been instructed on how to use properly.

## 2016-04-21 ENCOUNTER — Ambulatory Visit (INDEPENDENT_AMBULATORY_CARE_PROVIDER_SITE_OTHER): Payer: BLUE CROSS/BLUE SHIELD

## 2016-04-21 DIAGNOSIS — J309 Allergic rhinitis, unspecified: Secondary | ICD-10-CM

## 2016-04-28 ENCOUNTER — Ambulatory Visit (INDEPENDENT_AMBULATORY_CARE_PROVIDER_SITE_OTHER): Payer: BLUE CROSS/BLUE SHIELD | Admitting: *Deleted

## 2016-04-28 DIAGNOSIS — J309 Allergic rhinitis, unspecified: Secondary | ICD-10-CM | POA: Diagnosis not present

## 2016-05-05 ENCOUNTER — Ambulatory Visit (INDEPENDENT_AMBULATORY_CARE_PROVIDER_SITE_OTHER): Payer: BLUE CROSS/BLUE SHIELD | Admitting: *Deleted

## 2016-05-05 DIAGNOSIS — J309 Allergic rhinitis, unspecified: Secondary | ICD-10-CM | POA: Diagnosis not present

## 2016-05-12 ENCOUNTER — Ambulatory Visit (INDEPENDENT_AMBULATORY_CARE_PROVIDER_SITE_OTHER): Payer: BLUE CROSS/BLUE SHIELD | Admitting: *Deleted

## 2016-05-12 DIAGNOSIS — J309 Allergic rhinitis, unspecified: Secondary | ICD-10-CM | POA: Diagnosis not present

## 2016-05-19 ENCOUNTER — Other Ambulatory Visit (HOSPITAL_COMMUNITY)
Admission: AD | Admit: 2016-05-19 | Discharge: 2016-05-19 | Disposition: A | Discharge status: Home / Self Care | Payer: BLUE CROSS/BLUE SHIELD | Source: Skilled Nursing Facility | Attending: Urology | Admitting: Urology

## 2016-05-19 ENCOUNTER — Ambulatory Visit (INDEPENDENT_AMBULATORY_CARE_PROVIDER_SITE_OTHER): Payer: BLUE CROSS/BLUE SHIELD | Admitting: Urology

## 2016-05-19 ENCOUNTER — Ambulatory Visit (INDEPENDENT_AMBULATORY_CARE_PROVIDER_SITE_OTHER): Payer: BLUE CROSS/BLUE SHIELD | Admitting: *Deleted

## 2016-05-19 DIAGNOSIS — R311 Benign essential microscopic hematuria: Secondary | ICD-10-CM | POA: Diagnosis not present

## 2016-05-19 DIAGNOSIS — J309 Allergic rhinitis, unspecified: Secondary | ICD-10-CM | POA: Diagnosis not present

## 2016-05-19 LAB — URINALYSIS, ROUTINE W REFLEX MICROSCOPIC
BILIRUBIN URINE: NEGATIVE
GLUCOSE, UA: NEGATIVE mg/dL
KETONES UR: NEGATIVE mg/dL
Leukocytes, UA: NEGATIVE
Nitrite: NEGATIVE
PROTEIN: NEGATIVE mg/dL
Specific Gravity, Urine: >1.03 — ABNORMAL HIGH (ref 1.005–1.030)
pH: 5.5 (ref 5.0–8.0)

## 2016-05-19 LAB — URINE MICROSCOPIC-ADD ON

## 2016-05-26 ENCOUNTER — Ambulatory Visit (INDEPENDENT_AMBULATORY_CARE_PROVIDER_SITE_OTHER): Payer: BLUE CROSS/BLUE SHIELD | Admitting: *Deleted

## 2016-05-26 DIAGNOSIS — J309 Allergic rhinitis, unspecified: Secondary | ICD-10-CM

## 2016-06-09 ENCOUNTER — Ambulatory Visit (INDEPENDENT_AMBULATORY_CARE_PROVIDER_SITE_OTHER): Payer: BLUE CROSS/BLUE SHIELD | Admitting: *Deleted

## 2016-06-09 DIAGNOSIS — J309 Allergic rhinitis, unspecified: Secondary | ICD-10-CM

## 2016-06-16 ENCOUNTER — Ambulatory Visit (INDEPENDENT_AMBULATORY_CARE_PROVIDER_SITE_OTHER): Payer: BLUE CROSS/BLUE SHIELD

## 2016-06-16 DIAGNOSIS — J309 Allergic rhinitis, unspecified: Secondary | ICD-10-CM

## 2016-06-22 ENCOUNTER — Ambulatory Visit (INDEPENDENT_AMBULATORY_CARE_PROVIDER_SITE_OTHER): Payer: BLUE CROSS/BLUE SHIELD | Admitting: Allergy and Immunology

## 2016-06-22 ENCOUNTER — Encounter: Payer: Self-pay | Admitting: Allergy and Immunology

## 2016-06-22 VITALS — BP 118/70 | HR 82 | Temp 97.7°F | Resp 16

## 2016-06-22 DIAGNOSIS — J3089 Other allergic rhinitis: Secondary | ICD-10-CM | POA: Diagnosis not present

## 2016-06-22 DIAGNOSIS — Z8719 Personal history of other diseases of the digestive system: Secondary | ICD-10-CM | POA: Diagnosis not present

## 2016-06-22 DIAGNOSIS — J45901 Unspecified asthma with (acute) exacerbation: Secondary | ICD-10-CM | POA: Diagnosis not present

## 2016-06-22 MED ORDER — MOMETASONE FURO-FORMOTEROL FUM 200-5 MCG/ACT IN AERO: 2.0000 | INHALATION_SPRAY | Freq: Two times a day (BID) | RESPIRATORY_TRACT | 5 refills | Status: DC

## 2016-06-22 MED ORDER — PREDNISONE 1 MG PO TABS: 10.0000 mg | ORAL_TABLET | Freq: Every day | ORAL | Status: DC

## 2016-06-22 NOTE — Assessment & Plan Note (Signed)
   Prednisone has been provided, 40 mg x3 days, 20 mg x1 day, 10 mg x1 day, then stop.  Samples and prescription have been [facility] (mometasone/formoterol) 200/5 g, 2 inhalations via spacer device twice a day.  Continue montelukast 10 mg daily at bedtime and albuterol HFA, 1-2 inhalations every 4-6 hours as needed and 15 minutes prior to exercise.  The patient has been asked to contact me if his symptoms persist or progress. Otherwise, he may return for follow up in 4 months.

## 2016-06-22 NOTE — Assessment & Plan Note (Signed)
   Continue appropriate allergen avoidance measures, aeroallergen immunotherapy buildup as prescribed and as tolerated, montelukast 10 mg daily, and fluticasone nasal spray as needed.  I have also recommended nasal saline spray (i.e., Simply Saline) or nasal saline lavage (i.e., NeilMed) as needed prior to medicated nasal sprays.

## 2016-06-22 NOTE — Patient Instructions (Addendum)
Asthma with acute exacerbation  Prednisone has been provided, 40 mg x3 days, 20 mg x1 day, 10 mg x1 day, then stop.  Samples and prescription have been provided [facility] (m[facility]asone/formoterol) 200/5 g,  2 inhalations via spacer device twice a day.  Continue montelukast 10 mg daily at bedtime and albuterol HFA, 1-2 inhalations every 4-6 hours as needed and 15 minutes prior to exercise.  The patient has been asked to contact me if his symptoms persist or progress. Otherwise, he may return for follow up in 4 months.  Perennial and seasonal allergic rhinitis  Continue appropriate allergen avoidance measures, aeroallergen immunotherapy buildup as prescribed and as tolerated, montelukast 10 mg daily, and fluticasone nasal spray as needed.  I have also recommended nasal saline spray (i.e., Simply Saline) or nasal saline lavage (i.e., NeilMed) as needed prior to medicated nasal sprays.  History of esophageal reflux  Quiescent despite the fact that he has not been taking a proton pump inhibitor for quite some time. His cough correlates with environmental exposure and seasonal changes and he denies other symptoms associated with acid reflux.  Therefore, we will not restart proton pump inhibitor at this time.   Return in about 4 months (around 10/21/2016), or if symptoms worsen or fail to improve.

## 2016-06-22 NOTE — Progress Notes (Signed)
Follow-up Note  RE: Antonio SchirmerDavid M Holmes MRN: [id number] DOB: 04/12/1961 Date of Office Visit: 06/22/2016  Primary care provider: Gweneth DimitriMCNEILL,WENDY, Holmes Referring provider: Gweneth DimitriMcNeill, Wendy, Holmes  History of present illness: Antonio HawkingDavid Holmes is a 55 y.o. male with asthma, a persistent cough, and allergic rhinitis presenting today for sick visit.  He was last seen in this clinic on 02/18/2016.  He reports that over the past few weeks during the rapid weather changes he has been experiencing increased asthma symptoms, particularly coughing and dyspnea.  He states that it is "hard to breathe" when he is outdoors and his exertion tolerance has decreased significantly.  He has been coughing throughout the day and finds it challenging to climb one flight of stairs without coughing and dyspnea.  He currently takes Symbicort 160/4.5 g, 2 inhalations via spacer device twice a day, however his insurance no longer pay for this medication.  His nasal symptoms have improved significantly with aeroallergen immunotherapy, fluticasone nasal spray, and montelukast.  He discontinued proton pump inhibitor several months ago and denies reflux related symptoms.  He does not believe that his cough is due to reflux based on the fact that the cough correlates with being outdoors, it is seasonal, and he perceived no symptom relief while on the PPI.   Assessment and plan: Asthma with acute exacerbation  Prednisone has been provided, 40 mg x3 days, 20 mg x1 day, 10 mg x1 day, then stop.  [facility] and prescription [facility]mometasone/formoterol) 200/5 g,  2 inhalations via spacer device twice a day.  Continue montelukast 10 mg daily at bedtime and albuterol HFA, 1-2 inhalations every 4-6 hours as needed and 15 minutes prior to exercise.  The patient has been asked to contact me if his symptoms persist or progress. Otherwise, he may return for follow up in 4 months.  Perennial and seasonal allergic  rhinitis  Continue appropriate allergen avoidance measures, aeroallergen immunotherapy buildup as prescribed and as tolerated, montelukast 10 mg daily, and fluticasone nasal spray as needed.  I have also recommended nasal saline spray (i.e., Simply Saline) or nasal saline lavage (i.e., NeilMed) as needed prior to medicated nasal sprays.  History of esophageal reflux  Quiescent despite the fact that he has not been taking a proton pump inhibitor for quite some time. His cough correlates with environmental exposure and seasonal changes and he denies other symptoms associated with acid reflux.  Therefore, we will not restart proton pump inhibitor at this time.   Meds ordered this encounter  Medications  . predniSONE (DELTASONE) tablet 10 mg  . mometasone-formoterol (DULERA) 200-5 MCG/ACT AERO    Sig: Inhale 2 puffs into the lungs 2 (two) times daily.    Dispense:  1 Inhaler    Refill:  5    Diagnostics: Spirometry reveals an FVC of 3.30 L (84% predicted) and an FEV1 of 1.77 L (58% predicted) with significant (760 mL, 43%) post bronchodilator improvement.  Please see scanned spirometry results for details.   Physical examination: Blood pressure 118/70, pulse 82, temperature 97.7 F (36.5 C), temperature source Oral, resp. rate 16, SpO2 97 %.  General: Alert, interactive, in no acute distress. HEENT: TMs pearly gray, turbinates mildly edematous without discharge, post-pharynx mildly erythematous. Neck: Supple without lymphadenopathy. Lungs: Mildly decreased breath sounds bilaterally without wheezing, rhonchi or rales. CV: Normal S1, S2 without murmurs. Skin: Warm and dry, without lesions or rashes.  The following portions of the patient's history were reviewed and updated as appropriate: allergies,  current medications, past family history, past medical history, past social history, past surgical history and problem list.    Medication List       Accurate as of 06/22/16 11:18 AM.  Always use your most recent med list.          acetaminophen 325 MG tablet Commonly known as:  TYLENOL Take 650 mg by mouth every 6 (six) hours as needed.   albuterol 108 (90 Base) MCG/ACT inhaler Commonly known as:  PROAIR HFA 2 puffs every 4 hours as needed only  if your can't catch your breath   Azelastine HCl 0.15 % Soln Place 2 sprays into both nostrils 2 (two) times daily.   budesonide-formoterol 80-4.5 MCG/ACT inhaler Commonly known as:  SYMBICORT Take 2 puffs first thing in am and then another 2 puffs about 12 hours later.   cetirizine 10 MG tablet Commonly known as:  ZYRTEC Take 10 mg by mouth daily as needed.   EPINEPHrine 0.3 mg/0.3 mL Soaj injection Commonly known as:  EPI-PEN Inject 0.3 mLs (0.3 mg total) into the muscle once.   famotidine 20 MG tablet Commonly known as:  PEPCID One at bedtime   fluticasone 50 MCG/ACT nasal spray Commonly known as:  FLONASE Place 2 sprays into both nostrils daily.   levocetirizine 5 MG tablet Commonly known as:  XYZAL Take 5 mg by mouth every evening.   mometasone-formoterol 200-5 MCG/ACT Aero Commonly known as:  DULERA Inhale 2 puffs into the lungs 2 (two) times daily.   montelukast 10 MG tablet Commonly known as:  SINGULAIR Take 1 tablet (10 mg total) by mouth at bedtime.   multivitamin capsule Take 1 capsule by mouth daily.       Allergies  Allergen Reactions  . Tramadol     "made my skin crawl"   Review of systems: Review of systems negative except as noted in HPI / PMHx or noted below: Constitutional: Negative.  HENT: Negative.   Eyes: Negative.  Respiratory: Negative.   Cardiovascular: Negative.  Gastrointestinal: Negative.  Genitourinary: Negative.  Musculoskeletal: Negative.  Neurological: Negative.  Endo/Heme/Allergies: Negative.  Cutaneous: Negative.  Past Medical History:  Diagnosis Date  . Asthma   . Cough     Family History  Problem Relation Age of Onset  . Hypertension Father    . Allergic rhinitis Neg Hx   . Angioedema Neg Hx   . Asthma Neg Hx   . Atopy Neg Hx   . Eczema Neg Hx   . Immunodeficiency Neg Hx   . Urticaria Neg Hx     Social History   Social History  . Marital status: Married    Spouse name: N/A  . Number of children: N/A  . Years of education: N/A   Occupational History  . 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 Smoking status: Former Smoker    Packs/day: 1.00    Years: 0.50    Types: Cigarettes    Quit date: 08/24/1992  . Smokeless tobacco: Never Used  . Alcohol use No  . Drug use: No  . Sexual activity: Not on file   Other Topics Concern  . Not on file   Social History Narrative  . No narrative on file    I appreciate the opportunity to take part in Otto's care. Please do not hesitate to contact me with questions.  Sincerely,   R. Jorene Guestarter Gabrianna Fassnacht, Holmes

## 2016-06-22 NOTE — Assessment & Plan Note (Addendum)
   Quiescent despite the fact that he has not been taking a proton pump inhibitor for quite some time. His cough correlates with environmental exposure and seasonal changes and he denies other symptoms associated with acid reflux.  Therefore, we will not restart proton pump inhibitor at this time.

## 2016-06-23 ENCOUNTER — Ambulatory Visit (INDEPENDENT_AMBULATORY_CARE_PROVIDER_SITE_OTHER): Payer: BLUE CROSS/BLUE SHIELD | Admitting: *Deleted

## 2016-06-23 DIAGNOSIS — J309 Allergic rhinitis, unspecified: Secondary | ICD-10-CM | POA: Diagnosis not present

## 2016-06-26 ENCOUNTER — Encounter: Payer: Self-pay | Admitting: Allergy and Immunology

## 2016-06-30 ENCOUNTER — Other Ambulatory Visit (HOSPITAL_COMMUNITY)
Admission: RE | Admit: 2016-06-30 | Discharge: 2016-06-30 | Disposition: A | Discharge status: Home / Self Care | Payer: BLUE CROSS/BLUE SHIELD | Source: Ambulatory Visit | Attending: Urology | Admitting: Urology

## 2016-06-30 ENCOUNTER — Ambulatory Visit (INDEPENDENT_AMBULATORY_CARE_PROVIDER_SITE_OTHER): Payer: BLUE CROSS/BLUE SHIELD | Admitting: *Deleted

## 2016-06-30 DIAGNOSIS — R311 Benign essential microscopic hematuria: Secondary | ICD-10-CM | POA: Diagnosis not present

## 2016-06-30 DIAGNOSIS — J309 Allergic rhinitis, unspecified: Secondary | ICD-10-CM | POA: Diagnosis not present

## 2016-06-30 LAB — URINALYSIS, ROUTINE W REFLEX MICROSCOPIC
Bilirubin Urine: NEGATIVE
Glucose, UA: NEGATIVE mg/dL
KETONES UR: NEGATIVE mg/dL
LEUKOCYTES UA: NEGATIVE
Nitrite: NEGATIVE
PROTEIN: NEGATIVE mg/dL
Specific Gravity, Urine: <1.005 — ABNORMAL LOW (ref 1.005–1.030)
pH: 5.5 (ref 5.0–8.0)

## 2016-06-30 LAB — URINE MICROSCOPIC-ADD ON
Bacteria, UA: NONE SEEN
Squamous Epithelial / LPF: NONE SEEN
WBC UA: NONE SEEN WBC/hpf (ref 0–5)

## 2016-07-07 ENCOUNTER — Ambulatory Visit (INDEPENDENT_AMBULATORY_CARE_PROVIDER_SITE_OTHER): Payer: BLUE CROSS/BLUE SHIELD | Admitting: *Deleted

## 2016-07-07 DIAGNOSIS — J309 Allergic rhinitis, unspecified: Secondary | ICD-10-CM

## 2016-07-14 ENCOUNTER — Ambulatory Visit (INDEPENDENT_AMBULATORY_CARE_PROVIDER_SITE_OTHER): Payer: BLUE CROSS/BLUE SHIELD

## 2016-07-14 DIAGNOSIS — J301 Allergic rhinitis due to pollen: Secondary | ICD-10-CM

## 2016-07-14 DIAGNOSIS — J309 Allergic rhinitis, unspecified: Secondary | ICD-10-CM

## 2016-07-21 ENCOUNTER — Ambulatory Visit (INDEPENDENT_AMBULATORY_CARE_PROVIDER_SITE_OTHER): Payer: BLUE CROSS/BLUE SHIELD | Admitting: *Deleted

## 2016-07-21 DIAGNOSIS — J309 Allergic rhinitis, unspecified: Secondary | ICD-10-CM | POA: Diagnosis not present

## 2016-07-28 ENCOUNTER — Ambulatory Visit (INDEPENDENT_AMBULATORY_CARE_PROVIDER_SITE_OTHER): Payer: BLUE CROSS/BLUE SHIELD | Admitting: *Deleted

## 2016-07-28 DIAGNOSIS — J309 Allergic rhinitis, unspecified: Secondary | ICD-10-CM | POA: Diagnosis not present

## 2016-08-04 ENCOUNTER — Ambulatory Visit (INDEPENDENT_AMBULATORY_CARE_PROVIDER_SITE_OTHER): Payer: BLUE CROSS/BLUE SHIELD | Admitting: *Deleted

## 2016-08-04 DIAGNOSIS — J309 Allergic rhinitis, unspecified: Secondary | ICD-10-CM

## 2016-08-11 ENCOUNTER — Ambulatory Visit (INDEPENDENT_AMBULATORY_CARE_PROVIDER_SITE_OTHER): Payer: BLUE CROSS/BLUE SHIELD | Admitting: *Deleted

## 2016-08-11 DIAGNOSIS — J309 Allergic rhinitis, unspecified: Secondary | ICD-10-CM

## 2016-08-25 ENCOUNTER — Ambulatory Visit (INDEPENDENT_AMBULATORY_CARE_PROVIDER_SITE_OTHER): Payer: BLUE CROSS/BLUE SHIELD | Admitting: *Deleted

## 2016-08-25 DIAGNOSIS — J309 Allergic rhinitis, unspecified: Secondary | ICD-10-CM | POA: Diagnosis not present

## 2016-09-01 ENCOUNTER — Ambulatory Visit (INDEPENDENT_AMBULATORY_CARE_PROVIDER_SITE_OTHER): Payer: BLUE CROSS/BLUE SHIELD | Admitting: *Deleted

## 2016-09-01 DIAGNOSIS — J309 Allergic rhinitis, unspecified: Secondary | ICD-10-CM | POA: Diagnosis not present

## 2016-09-08 ENCOUNTER — Ambulatory Visit (INDEPENDENT_AMBULATORY_CARE_PROVIDER_SITE_OTHER): Payer: BLUE CROSS/BLUE SHIELD | Admitting: *Deleted

## 2016-09-08 DIAGNOSIS — J309 Allergic rhinitis, unspecified: Secondary | ICD-10-CM

## 2016-09-15 ENCOUNTER — Ambulatory Visit (INDEPENDENT_AMBULATORY_CARE_PROVIDER_SITE_OTHER): Payer: BLUE CROSS/BLUE SHIELD | Admitting: *Deleted

## 2016-09-15 ENCOUNTER — Encounter: Payer: Self-pay | Admitting: Allergy and Immunology

## 2016-09-15 DIAGNOSIS — J309 Allergic rhinitis, unspecified: Secondary | ICD-10-CM

## 2016-09-16 ENCOUNTER — Other Ambulatory Visit: Payer: Self-pay | Admitting: *Deleted

## 2016-09-18 ENCOUNTER — Encounter: Payer: Self-pay | Admitting: Allergy and Immunology

## 2016-09-21 ENCOUNTER — Other Ambulatory Visit: Payer: Self-pay | Admitting: *Deleted

## 2016-09-21 MED ORDER — BUDESONIDE-FORMOTEROL FUMARATE 160-4.5 MCG/ACT IN AERO: 2.0000 | INHALATION_SPRAY | Freq: Two times a day (BID) | RESPIRATORY_TRACT | 3 refills | Status: DC

## 2016-09-21 NOTE — Telephone Encounter (Signed)
Insurance will cover Adviar, Breo or Symbicort. Please advise.

## 2016-09-22 ENCOUNTER — Ambulatory Visit (INDEPENDENT_AMBULATORY_CARE_PROVIDER_SITE_OTHER): Payer: BC Managed Care – PPO | Admitting: *Deleted

## 2016-09-22 DIAGNOSIS — J309 Allergic rhinitis, unspecified: Secondary | ICD-10-CM

## 2016-09-23 ENCOUNTER — Other Ambulatory Visit: Payer: Self-pay | Admitting: Allergy

## 2016-09-23 MED ORDER — FLUTICASONE-SALMETEROL 230-21 MCG/ACT IN AERO: 2.0000 | INHALATION_SPRAY | Freq: Two times a day (BID) | RESPIRATORY_TRACT | 5 refills | Status: DC

## 2016-09-29 ENCOUNTER — Ambulatory Visit (INDEPENDENT_AMBULATORY_CARE_PROVIDER_SITE_OTHER): Payer: BC Managed Care – PPO | Admitting: *Deleted

## 2016-09-29 DIAGNOSIS — J309 Allergic rhinitis, unspecified: Secondary | ICD-10-CM

## 2016-10-06 ENCOUNTER — Ambulatory Visit (INDEPENDENT_AMBULATORY_CARE_PROVIDER_SITE_OTHER): Payer: BC Managed Care – PPO | Admitting: *Deleted

## 2016-10-06 DIAGNOSIS — J309 Allergic rhinitis, unspecified: Secondary | ICD-10-CM | POA: Diagnosis not present

## 2016-10-12 ENCOUNTER — Ambulatory Visit (INDEPENDENT_AMBULATORY_CARE_PROVIDER_SITE_OTHER): Payer: BC Managed Care – PPO | Admitting: Allergy and Immunology

## 2016-10-12 ENCOUNTER — Encounter: Payer: Self-pay | Admitting: Allergy and Immunology

## 2016-10-12 VITALS — BP 108/68 | HR 80 | Resp 20 | Ht 64.0 in | Wt 190.8 lb

## 2016-10-12 DIAGNOSIS — J45901 Unspecified asthma with (acute) exacerbation: Secondary | ICD-10-CM | POA: Diagnosis not present

## 2016-10-12 DIAGNOSIS — Z8719 Personal history of other diseases of the digestive system: Secondary | ICD-10-CM | POA: Diagnosis not present

## 2016-10-12 DIAGNOSIS — J3089 Other allergic rhinitis: Secondary | ICD-10-CM

## 2016-10-12 MED ORDER — PREDNISONE 1 MG PO TABS: 10.0000 mg | ORAL_TABLET | Freq: Every day | ORAL | Status: DC

## 2016-10-12 MED ORDER — TIOTROPIUM BROMIDE MONOHYDRATE 1.25 MCG/ACT IN AERS: 2.0000 | INHALATION_SPRAY | Freq: Every day | RESPIRATORY_TRACT | 5 refills | Status: DC

## 2016-10-12 NOTE — Patient Instructions (Addendum)
Asthma with acute exacerbation  Prednisone has been provided, 20 mg x 4 days, 10 mg x1 day, then stop.  A prescription has been provided for Spiriva Respimat 1.25 g , 2 inhalations once daily.  Continue Symbicort 160-4.5 g, 2 inhalations via spacer device twice a day, montelukast 10 mg daily at bedtime, and albuterol HFA, 1-2 inhalations every 4-6 hours as needed.  If symptoms persist or progress despite treatment plan as outlined above, we will consider more aggressive treatment with a biologic agent.  Perennial and seasonal allergic rhinitis  Continue appropriate allergen avoidance measures, aeroallergen immunotherapy as prescribed and as tolerated, montelukast 10 mg daily, and fluticasone nasal spray as needed.  History of esophageal reflux  Continue appropriate reflux lifestyle modifications.  If the cough persists or progresses despite the treatment plan as outlined above, we will add ranitidine 150 mg twice a day and assess for symptom change to see if reflux/silent reflux may be concerning.   Return in about 4 months (around 02/09/2017), or if symptoms worsen or fail to improve.

## 2016-10-12 NOTE — Assessment & Plan Note (Signed)
   Continue appropriate reflux lifestyle modifications.  If the cough persists or progresses despite the treatment plan as outlined above, we will add ranitidine 150 mg twice a day and assess for symptom change to see if reflux/silent reflux may be concerning.

## 2016-10-12 NOTE — Assessment & Plan Note (Addendum)
   Continue appropriate allergen avoidance measures, aeroallergen immunotherapy as prescribed and as tolerated, montelukast 10 mg daily, and fluticasone nasal spray as needed. 

## 2016-10-12 NOTE — Assessment & Plan Note (Addendum)
   Prednisone has been provided, 20 mg x 4 days, 10 mg x1 day, then stop.  A prescription has been provided for Spiriva Respimat 1.25 g, 2 inhalations once daily.  Continue Symbicort 160-4.5 g, 2 inhalations via spacer device twice a day, montelukast 10 mg daily at bedtime, and albuterol HFA, 1-2 inhalations every 4-6 hours as needed.  If symptoms persist or progress despite treatment plan as outlined above, we will consider more aggressive treatment with a biologic agent.

## 2016-10-12 NOTE — Progress Notes (Signed)
Follow-up Note  RE: Antonio SchirmerDavid M Byington MRN: [id number] DOB: 05/11/1961 Date of Office Visit: 10/12/2016  Primary care provider: Gweneth DimitriMCNEILL,WENDY, MD Referring provider: Gweneth DimitriMcNeill, Wendy, MD  History of present illness: Leanord HawkingDavid Holmes is a 56 y.o. male with persistent asthma, allergic rhinitis, and acid reflux presenting today for sick visit.  He was last seen in this clinic in October 2017.  He complains of a persistent cough as well as dyspnea and wheezing with mild/moderate exertion.  These symptoms occur despite compliance with Symbicort 160-4.5 g, 2 inhalations via spacer device twice a day, and montelukast 10 mg daily at bedtime.  The cough seems to be worse when he lies down at bedtime and when he wakes up in the morning.  He had been taking Dulera 200-5 g, however his insurance no longer covers this medication.  His nasal/sinus symptoms have been relatively well-controlled in the interval since his last visit.  He experiences rare heartburn and occasionally takes Tums or Rolaids for symptom relief.   Assessment and plan: Asthma with acute exacerbation  Prednisone has been provided, 20 mg x 4 days, 10 mg x1 day, then stop.  A prescription has been provided for Spiriva Respimat 1.25 g, 2 inhalations once daily.  Continue Symbicort 160-4.5 g, 2 inhalations via spacer device twice a day, montelukast 10 mg daily at bedtime, and albuterol HFA, 1-2 inhalations every 4-6 hours as needed.  If symptoms persist or progress despite treatment plan as outlined above, we will consider more aggressive treatment with a biologic agent.  Perennial and seasonal allergic rhinitis  Continue appropriate allergen avoidance measures, aeroallergen immunotherapy as prescribed and as tolerated, montelukast 10 mg daily, and fluticasone nasal spray as needed.  History of esophageal reflux  Continue appropriate reflux lifestyle modifications.  If the cough persists or progresses despite the treatment plan as  outlined above, we will add ranitidine 150 mg twice a day and assess for symptom change to see if reflux/silent reflux may be concerning.   Meds ordered this encounter  Medications  . Tiotropium Bromide Monohydrate (SPIRIVA RESPIMAT) 1.25 MCG/ACT AERS    Sig: Inhale 2 puffs into the lungs daily.    Dispense:  4 g    Refill:  5  . predniSONE (DELTASONE) tablet 10 mg    Diagnostics: Spirometry reveals an FVC of 2.99 L (91% predicted) and FEV1 of 1.74 L (66% predicted) without post bronchodilator improvement.  Moderate airways obstruction without reversibility.  He has demonstrated significant reversibility on previous studies.   Please see scanned spirometry results for details.    Physical examination: Blood pressure 108/68, pulse 80, resp. rate 20, height 5\' 4"  (1.626 m), weight 190 lb 12.8 oz (86.5 kg).  General: Alert, interactive, in no acute distress. HEENT: TMs pearly gray, turbinates moderately edematous without discharge, post-pharynx mildly erythematous. Neck: Supple without lymphadenopathy. Lungs: Mildly decreased breath sounds bilaterally without wheezing, rhonchi or rales. CV: Normal S1, S2 without murmurs. Skin: Warm and dry, without lesions or rashes.  The following portions of the patient's history were reviewed and updated as appropriate: allergies, current medications, past family history, past medical history, past social history, past surgical history and problem list.  Allergies as of 10/12/2016      Reactions   Tramadol    "made my skin crawl"      Medication List       Accurate as of 10/12/16 12:14 PM. Always use your most recent med list.          acetaminophen  325 MG tablet Commonly known as:  TYLENOL Take 650 mg by mouth every 6 (six) hours as needed.   albuterol 108 (90 Base) MCG/ACT inhaler Commonly known as:  PROAIR HFA 2 puffs every 4 hours as needed only  if your can't catch your breath   Azelastine HCl 0.15 % Soln Place 2 sprays into both  nostrils 2 (two) times daily.   budesonide-formoterol 160-4.5 MCG/ACT inhaler Commonly known as:  SYMBICORT Inhale 2 puffs into the lungs 2 (two) times daily.   cetirizine 10 MG tablet Commonly known as:  ZYRTEC Take 10 mg by mouth daily as needed.   EPINEPHrine 0.3 mg/0.3 mL Soaj injection Commonly known as:  EPI-PEN Inject 0.3 mLs (0.3 mg total) into the muscle once.   fluticasone 50 MCG/ACT nasal spray Commonly known as:  FLONASE Place 2 sprays into both nostrils daily.   levocetirizine 5 MG tablet Commonly known as:  XYZAL Take 5 mg by mouth every evening.   montelukast 10 MG tablet Commonly known as:  SINGULAIR Take 1 tablet (10 mg total) by mouth at bedtime.   multivitamin capsule Take 1 capsule by mouth daily.   Tiotropium Bromide Monohydrate 1.25 MCG/ACT Aers Commonly known as:  SPIRIVA RESPIMAT Inhale 2 puffs into the lungs daily.       Allergies  Allergen Reactions  . Tramadol     "made my skin crawl"   Review of systems: Review of systems negative except as noted in HPI / PMHx or noted below: Constitutional: Negative.  HENT: Negative.   Eyes: Negative.  Respiratory: Negative.   Cardiovascular: Negative.  Gastrointestinal: Negative.  Genitourinary: Negative.  Musculoskeletal: Negative.  Neurological: Negative.  Endo/Heme/Allergies: Negative.  Cutaneous: Negative.  Past Medical History:  Diagnosis Date  . Asthma   . Cough     Family History  Problem Relation Age of Onset  . Hypertension Father   . Allergic rhinitis Neg Hx   . Angioedema Neg Hx   . Asthma Neg Hx   . Atopy Neg Hx   . Eczema Neg Hx   . Immunodeficiency Neg Hx   . Urticaria Neg Hx     Social History   Social History  . Marital status: Married    Spouse name: N/A  . Number of children: N/A  . Years of education: N/A   Occupational History  . Operates [employer]     Social History Main Topics  . Smoking status: Former Smoker    Packs/day: 1.00    Years: 0.50     Types: Cigarettes    Quit date: 08/24/1992  . Smokeless tobacco: Never Used  . Alcohol use No  . Drug use: No  . Sexual activity: Not on file   Other Topics Concern  . Not on file   Social History Narrative  . No narrative on file    I appreciate the opportunity to take part in Renard's care. Please do not hesitate to contact me with questions.  Sincerely,   R. Jorene Guest, MD

## 2016-10-20 ENCOUNTER — Ambulatory Visit (INDEPENDENT_AMBULATORY_CARE_PROVIDER_SITE_OTHER): Payer: BC Managed Care – PPO | Admitting: *Deleted

## 2016-10-20 DIAGNOSIS — J309 Allergic rhinitis, unspecified: Secondary | ICD-10-CM | POA: Diagnosis not present

## 2016-10-27 ENCOUNTER — Ambulatory Visit (INDEPENDENT_AMBULATORY_CARE_PROVIDER_SITE_OTHER): Payer: BC Managed Care – PPO | Admitting: *Deleted

## 2016-10-27 DIAGNOSIS — J309 Allergic rhinitis, unspecified: Secondary | ICD-10-CM

## 2016-11-03 ENCOUNTER — Ambulatory Visit (INDEPENDENT_AMBULATORY_CARE_PROVIDER_SITE_OTHER): Payer: BC Managed Care – PPO | Admitting: *Deleted

## 2016-11-03 DIAGNOSIS — J309 Allergic rhinitis, unspecified: Secondary | ICD-10-CM

## 2016-11-10 ENCOUNTER — Ambulatory Visit (INDEPENDENT_AMBULATORY_CARE_PROVIDER_SITE_OTHER): Payer: BC Managed Care – PPO | Admitting: *Deleted

## 2016-11-10 DIAGNOSIS — J309 Allergic rhinitis, unspecified: Secondary | ICD-10-CM | POA: Diagnosis not present

## 2016-11-17 ENCOUNTER — Ambulatory Visit (INDEPENDENT_AMBULATORY_CARE_PROVIDER_SITE_OTHER): Payer: BC Managed Care – PPO | Admitting: *Deleted

## 2016-11-17 DIAGNOSIS — J309 Allergic rhinitis, unspecified: Secondary | ICD-10-CM

## 2016-11-24 ENCOUNTER — Ambulatory Visit (INDEPENDENT_AMBULATORY_CARE_PROVIDER_SITE_OTHER): Payer: BC Managed Care – PPO | Admitting: *Deleted

## 2016-11-24 DIAGNOSIS — J309 Allergic rhinitis, unspecified: Secondary | ICD-10-CM

## 2016-12-01 ENCOUNTER — Ambulatory Visit (INDEPENDENT_AMBULATORY_CARE_PROVIDER_SITE_OTHER): Payer: BC Managed Care – PPO | Admitting: *Deleted

## 2016-12-01 DIAGNOSIS — J309 Allergic rhinitis, unspecified: Secondary | ICD-10-CM

## 2016-12-08 ENCOUNTER — Ambulatory Visit (INDEPENDENT_AMBULATORY_CARE_PROVIDER_SITE_OTHER): Payer: BC Managed Care – PPO | Admitting: *Deleted

## 2016-12-08 DIAGNOSIS — J309 Allergic rhinitis, unspecified: Secondary | ICD-10-CM

## 2016-12-15 ENCOUNTER — Ambulatory Visit (INDEPENDENT_AMBULATORY_CARE_PROVIDER_SITE_OTHER): Payer: BC Managed Care – PPO | Admitting: *Deleted

## 2016-12-15 DIAGNOSIS — J309 Allergic rhinitis, unspecified: Secondary | ICD-10-CM | POA: Diagnosis not present

## 2016-12-22 ENCOUNTER — Ambulatory Visit (INDEPENDENT_AMBULATORY_CARE_PROVIDER_SITE_OTHER): Payer: BC Managed Care – PPO | Admitting: *Deleted

## 2016-12-22 DIAGNOSIS — J309 Allergic rhinitis, unspecified: Secondary | ICD-10-CM

## 2016-12-29 ENCOUNTER — Ambulatory Visit (INDEPENDENT_AMBULATORY_CARE_PROVIDER_SITE_OTHER): Payer: BC Managed Care – PPO | Admitting: *Deleted

## 2016-12-29 DIAGNOSIS — J309 Allergic rhinitis, unspecified: Secondary | ICD-10-CM | POA: Diagnosis not present

## 2017-01-05 ENCOUNTER — Ambulatory Visit (INDEPENDENT_AMBULATORY_CARE_PROVIDER_SITE_OTHER): Payer: BC Managed Care – PPO | Admitting: *Deleted

## 2017-01-05 DIAGNOSIS — J309 Allergic rhinitis, unspecified: Secondary | ICD-10-CM | POA: Diagnosis not present

## 2017-01-12 ENCOUNTER — Ambulatory Visit (INDEPENDENT_AMBULATORY_CARE_PROVIDER_SITE_OTHER): Payer: BC Managed Care – PPO | Admitting: *Deleted

## 2017-01-12 DIAGNOSIS — J309 Allergic rhinitis, unspecified: Secondary | ICD-10-CM

## 2017-01-19 ENCOUNTER — Ambulatory Visit (INDEPENDENT_AMBULATORY_CARE_PROVIDER_SITE_OTHER): Payer: BC Managed Care – PPO | Admitting: *Deleted

## 2017-01-19 DIAGNOSIS — J309 Allergic rhinitis, unspecified: Secondary | ICD-10-CM

## 2017-01-26 ENCOUNTER — Other Ambulatory Visit: Payer: Self-pay | Admitting: Allergy and Immunology

## 2017-01-26 ENCOUNTER — Ambulatory Visit (INDEPENDENT_AMBULATORY_CARE_PROVIDER_SITE_OTHER): Payer: BC Managed Care – PPO | Admitting: *Deleted

## 2017-01-26 DIAGNOSIS — J309 Allergic rhinitis, unspecified: Secondary | ICD-10-CM | POA: Diagnosis not present

## 2017-02-02 ENCOUNTER — Ambulatory Visit (INDEPENDENT_AMBULATORY_CARE_PROVIDER_SITE_OTHER): Payer: BC Managed Care – PPO | Admitting: *Deleted

## 2017-02-02 DIAGNOSIS — J309 Allergic rhinitis, unspecified: Secondary | ICD-10-CM | POA: Diagnosis not present

## 2017-02-02 NOTE — Progress Notes (Signed)
Vials to be made 02-03-17  jm 

## 2017-02-03 DIAGNOSIS — J301 Allergic rhinitis due to pollen: Secondary | ICD-10-CM | POA: Diagnosis not present

## 2017-02-08 ENCOUNTER — Ambulatory Visit: Payer: BC Managed Care – PPO | Admitting: Allergy and Immunology

## 2017-02-09 ENCOUNTER — Ambulatory Visit (INDEPENDENT_AMBULATORY_CARE_PROVIDER_SITE_OTHER): Payer: BC Managed Care – PPO | Admitting: *Deleted

## 2017-02-09 DIAGNOSIS — J309 Allergic rhinitis, unspecified: Secondary | ICD-10-CM | POA: Diagnosis not present

## 2017-02-15 ENCOUNTER — Ambulatory Visit: Payer: BC Managed Care – PPO | Admitting: Allergy and Immunology

## 2017-02-16 ENCOUNTER — Ambulatory Visit (INDEPENDENT_AMBULATORY_CARE_PROVIDER_SITE_OTHER): Payer: BC Managed Care – PPO | Admitting: *Deleted

## 2017-02-16 DIAGNOSIS — J309 Allergic rhinitis, unspecified: Secondary | ICD-10-CM | POA: Diagnosis not present

## 2017-02-22 ENCOUNTER — Ambulatory Visit (INDEPENDENT_AMBULATORY_CARE_PROVIDER_SITE_OTHER): Payer: BC Managed Care – PPO | Admitting: Allergy and Immunology

## 2017-02-22 ENCOUNTER — Encounter: Payer: Self-pay | Admitting: Allergy and Immunology

## 2017-02-22 VITALS — BP 126/82 | HR 72 | Resp 16

## 2017-02-22 DIAGNOSIS — J3089 Other allergic rhinitis: Secondary | ICD-10-CM | POA: Diagnosis not present

## 2017-02-22 DIAGNOSIS — J4540 Moderate persistent asthma, uncomplicated: Secondary | ICD-10-CM

## 2017-02-22 MED ORDER — BUDESONIDE-FORMOTEROL FUMARATE 80-4.5 MCG/ACT IN AERO: 2.0000 | INHALATION_SPRAY | Freq: Two times a day (BID) | RESPIRATORY_TRACT | 5 refills | Status: DC

## 2017-02-22 NOTE — Progress Notes (Signed)
Follow-up Note  RE: JAVARIOUS ELSAYED MRN: [id number] DOB: [birth year] Date of Office Visit: 02/22/2017  Primary care provider: Gweneth Dimitri, MD Referring provider: Gweneth Dimitri, MD  History of present illness: Antonio Holmes is a 56 y.o. male with persistent asthma and allergic rhinitis on immunotherapy presents today for follow up.  He was last seen in this clinic on 10/12/2016.  He reports that over the past few months his asthma has been well-controlled.  He requires albuterol rescue one time per month on average and denies nocturnal awakenings due to lower respiratory symptoms.  He currently takes Simcor 160-4.5 g, 2 inhalations via spacer device twice a day and montelukast 10 mg daily bedtime.  He had to discontinue Spiriva due to side effects, including dry mouth and dry nose.  He reports that his nasal symptoms have improved significantly while on aeroallergen immunotherapy and was even able to tolerate baling hay this past week without significant symptoms.   Assessment and plan: Moderate persistent asthma Well-controlled, we will stepdown therapy at this time.   A sample and prescription have been provided for Symbicort 80-4.5 g, 2 inhalations via spacer device twice a day.  He will maintain this dose until mid/late fall at which time he will switch back to Symbicort 160-4.5 g.  Continue albuterol HFA, 1-2 inhalations every 4-6 hours as needed.  Subjective and objective measures of pulmonary function will be followed and the treatment plan will be adjusted accordingly.  Perennial and seasonal allergic rhinitis Improved on immunotherapy.  Continue appropriate allergen avoidance measures, aeroallergen immunotherapy as prescribed and as tolerated, montelukast 10 mg daily, and fluticasone nasal spray as needed.  Medications will be decreased or discontinued as symptom relief from immunotherapy becomes evident.   Meds ordered this encounter  Medications  .  budesonide-formoterol (SYMBICORT) 80-4.5 MCG/ACT inhaler    Sig: Inhale 2 puffs into the lungs 2 (two) times daily.    Dispense:  1 Inhaler    Refill:  5    Diagnostics: Spirometry:  Normal with an FEV1 of 87% predicted.  Please see scanned spirometry results for details.    Physical examination: Blood pressure 126/82, pulse 72, resp. rate 16.  General: Alert, interactive, in no acute distress. HEENT: TMs pearly gray, turbinates minimally edematous without discharge, post-pharynx mildly erythematous. Neck: Supple without lymphadenopathy. Lungs: Clear to auscultation without wheezing, rhonchi or rales. CV: Normal S1, S2 without murmurs. Skin: Warm and dry, without lesions or rashes.  The following portions of the patient's history were reviewed and updated as appropriate: allergies, current medications, past family history, past medical history, past social history, past surgical history and problem list.  Allergies as of 02/22/2017      Reactions   Tramadol    "made my skin crawl"      Medication List       Accurate as of 02/22/17 11:33 AM. Always use your most recent med list.          acetaminophen 325 MG tablet Commonly known as:  TYLENOL Take 650 mg by mouth every 6 (six) hours as needed.   albuterol 108 (90 Base) MCG/ACT inhaler Commonly known as:  PROAIR HFA 2 puffs every 4 hours as needed only  if your can't catch your breath   budesonide-formoterol 80-4.5 MCG/ACT inhaler Commonly known as:  SYMBICORT Inhale 2 puffs into the lungs 2 (two) times daily.   EPINEPHrine 0.3 mg/0.3 mL Soaj injection Commonly known as:  EPI-PEN Inject 0.3 mLs (0.3 mg total) into  the muscle once.   fluticasone 50 MCG/ACT nasal spray Commonly known as:  FLONASE Place 2 sprays into both nostrils daily.   levocetirizine 5 MG tablet Commonly known as:  XYZAL Take 5 mg by mouth every evening.   montelukast 10 MG tablet Commonly known as:  SINGULAIR Take 1 tablet (10 mg total) by  mouth at bedtime.   multivitamin capsule Take 1 capsule by mouth daily.   Tiotropium Bromide Monohydrate 1.25 MCG/ACT Aers Commonly known as:  SPIRIVA RESPIMAT Inhale 2 puffs into the lungs daily.       Allergies  Allergen Reactions  . Tramadol     "made my skin crawl"    I appreciate the opportunity to take part in Trever's care. Please do not hesitate to contact me with questions.  Sincerely,   R. Jorene Guestarter Mcgregor Tinnon, MD

## 2017-02-22 NOTE — Assessment & Plan Note (Signed)
Well-controlled, we will stepdown therapy at this time.   A sample and prescription have been provided for Symbicort 80-4.5 g, 2 inhalations via spacer device twice a day.  He will maintain this dose until mid/late fall at which time he will switch back to Symbicort 160-4.5 g.  Continue albuterol HFA, 1-2 inhalations every 4-6 hours as needed.  Subjective and objective measures of pulmonary function will be followed and the treatment plan will be adjusted accordingly.

## 2017-02-22 NOTE — Patient Instructions (Signed)
Moderate persistent asthma Well-controlled, we will stepdown therapy at this time.   A sample and prescription have been provided for Symbicort 80-4.5 g, 2 inhalations via spacer device twice a day.  He will maintain this dose until mid/late fall at which time he will switch back to Symbicort 160-4.5 g.  Continue albuterol HFA, 1-2 inhalations every 4-6 hours as needed.  Subjective and objective measures of pulmonary function will be followed and the treatment plan will be adjusted accordingly.  Perennial and seasonal allergic rhinitis Improved on immunotherapy.  Continue appropriate allergen avoidance measures, aeroallergen immunotherapy as prescribed and as tolerated, montelukast 10 mg daily, and fluticasone nasal spray as needed.  Medications will be decreased or discontinued as symptom relief from immunotherapy becomes evident.   Return in about 4 months (around 06/25/2017), or if symptoms worsen or fail to improve.

## 2017-02-22 NOTE — Assessment & Plan Note (Signed)
Improved on immunotherapy.  Continue appropriate allergen avoidance measures, aeroallergen immunotherapy as prescribed and as tolerated, montelukast 10 mg daily, and fluticasone nasal spray as needed.  Medications will be decreased or discontinued as symptom relief from immunotherapy becomes evident.

## 2017-02-23 ENCOUNTER — Ambulatory Visit (INDEPENDENT_AMBULATORY_CARE_PROVIDER_SITE_OTHER): Payer: BC Managed Care – PPO | Admitting: *Deleted

## 2017-02-23 DIAGNOSIS — J309 Allergic rhinitis, unspecified: Secondary | ICD-10-CM | POA: Diagnosis not present

## 2017-03-02 ENCOUNTER — Ambulatory Visit (INDEPENDENT_AMBULATORY_CARE_PROVIDER_SITE_OTHER): Payer: BC Managed Care – PPO | Admitting: *Deleted

## 2017-03-02 DIAGNOSIS — J309 Allergic rhinitis, unspecified: Secondary | ICD-10-CM | POA: Diagnosis not present

## 2017-03-09 ENCOUNTER — Ambulatory Visit (INDEPENDENT_AMBULATORY_CARE_PROVIDER_SITE_OTHER): Payer: BC Managed Care – PPO | Admitting: *Deleted

## 2017-03-09 DIAGNOSIS — J309 Allergic rhinitis, unspecified: Secondary | ICD-10-CM

## 2017-03-16 ENCOUNTER — Ambulatory Visit (INDEPENDENT_AMBULATORY_CARE_PROVIDER_SITE_OTHER): Payer: BC Managed Care – PPO | Admitting: *Deleted

## 2017-03-16 DIAGNOSIS — J309 Allergic rhinitis, unspecified: Secondary | ICD-10-CM

## 2017-03-23 ENCOUNTER — Ambulatory Visit (INDEPENDENT_AMBULATORY_CARE_PROVIDER_SITE_OTHER): Payer: BC Managed Care – PPO

## 2017-03-23 DIAGNOSIS — J309 Allergic rhinitis, unspecified: Secondary | ICD-10-CM | POA: Diagnosis not present

## 2017-03-30 ENCOUNTER — Ambulatory Visit (INDEPENDENT_AMBULATORY_CARE_PROVIDER_SITE_OTHER): Payer: BC Managed Care – PPO | Admitting: *Deleted

## 2017-03-30 DIAGNOSIS — J309 Allergic rhinitis, unspecified: Secondary | ICD-10-CM | POA: Diagnosis not present

## 2017-04-06 ENCOUNTER — Ambulatory Visit (INDEPENDENT_AMBULATORY_CARE_PROVIDER_SITE_OTHER): Payer: BC Managed Care – PPO | Admitting: *Deleted

## 2017-04-06 DIAGNOSIS — J309 Allergic rhinitis, unspecified: Secondary | ICD-10-CM

## 2017-04-13 ENCOUNTER — Ambulatory Visit (INDEPENDENT_AMBULATORY_CARE_PROVIDER_SITE_OTHER): Payer: BC Managed Care – PPO | Admitting: *Deleted

## 2017-04-13 DIAGNOSIS — J309 Allergic rhinitis, unspecified: Secondary | ICD-10-CM | POA: Diagnosis not present

## 2017-04-20 ENCOUNTER — Ambulatory Visit (INDEPENDENT_AMBULATORY_CARE_PROVIDER_SITE_OTHER): Payer: BC Managed Care – PPO | Admitting: *Deleted

## 2017-04-20 DIAGNOSIS — J309 Allergic rhinitis, unspecified: Secondary | ICD-10-CM | POA: Diagnosis not present

## 2017-04-27 ENCOUNTER — Ambulatory Visit (INDEPENDENT_AMBULATORY_CARE_PROVIDER_SITE_OTHER): Payer: BC Managed Care – PPO | Admitting: *Deleted

## 2017-04-27 DIAGNOSIS — J309 Allergic rhinitis, unspecified: Secondary | ICD-10-CM | POA: Diagnosis not present

## 2017-05-04 ENCOUNTER — Ambulatory Visit (INDEPENDENT_AMBULATORY_CARE_PROVIDER_SITE_OTHER): Payer: BC Managed Care – PPO | Admitting: *Deleted

## 2017-05-04 DIAGNOSIS — J309 Allergic rhinitis, unspecified: Secondary | ICD-10-CM | POA: Diagnosis not present

## 2017-05-06 DIAGNOSIS — J301 Allergic rhinitis due to pollen: Secondary | ICD-10-CM | POA: Diagnosis not present

## 2017-05-06 NOTE — Progress Notes (Signed)
VIALS EXP 05-06-18 

## 2017-05-11 ENCOUNTER — Ambulatory Visit (INDEPENDENT_AMBULATORY_CARE_PROVIDER_SITE_OTHER): Payer: BC Managed Care – PPO | Admitting: *Deleted

## 2017-05-11 DIAGNOSIS — J309 Allergic rhinitis, unspecified: Secondary | ICD-10-CM | POA: Diagnosis not present

## 2017-05-18 ENCOUNTER — Ambulatory Visit (INDEPENDENT_AMBULATORY_CARE_PROVIDER_SITE_OTHER): Payer: BC Managed Care – PPO | Admitting: *Deleted

## 2017-05-18 DIAGNOSIS — J309 Allergic rhinitis, unspecified: Secondary | ICD-10-CM | POA: Diagnosis not present

## 2017-05-25 ENCOUNTER — Ambulatory Visit (INDEPENDENT_AMBULATORY_CARE_PROVIDER_SITE_OTHER): Payer: BC Managed Care – PPO

## 2017-05-25 DIAGNOSIS — J309 Allergic rhinitis, unspecified: Secondary | ICD-10-CM

## 2017-06-01 ENCOUNTER — Ambulatory Visit (INDEPENDENT_AMBULATORY_CARE_PROVIDER_SITE_OTHER): Payer: BC Managed Care – PPO

## 2017-06-01 DIAGNOSIS — J309 Allergic rhinitis, unspecified: Secondary | ICD-10-CM

## 2017-06-08 ENCOUNTER — Ambulatory Visit (INDEPENDENT_AMBULATORY_CARE_PROVIDER_SITE_OTHER): Payer: BC Managed Care – PPO | Admitting: *Deleted

## 2017-06-08 DIAGNOSIS — J309 Allergic rhinitis, unspecified: Secondary | ICD-10-CM

## 2017-06-15 ENCOUNTER — Ambulatory Visit (INDEPENDENT_AMBULATORY_CARE_PROVIDER_SITE_OTHER): Payer: BC Managed Care – PPO

## 2017-06-15 DIAGNOSIS — J309 Allergic rhinitis, unspecified: Secondary | ICD-10-CM | POA: Diagnosis not present

## 2017-06-22 ENCOUNTER — Encounter: Payer: Self-pay | Admitting: Allergy and Immunology

## 2017-06-22 MED ORDER — MONTELUKAST SODIUM 10 MG PO TABS: 10.0000 mg | ORAL_TABLET | Freq: Every day | ORAL | 2 refills | Status: DC

## 2017-06-29 ENCOUNTER — Ambulatory Visit (INDEPENDENT_AMBULATORY_CARE_PROVIDER_SITE_OTHER): Payer: BC Managed Care – PPO | Admitting: *Deleted

## 2017-06-29 DIAGNOSIS — J309 Allergic rhinitis, unspecified: Secondary | ICD-10-CM

## 2017-07-13 ENCOUNTER — Ambulatory Visit (INDEPENDENT_AMBULATORY_CARE_PROVIDER_SITE_OTHER): Payer: BC Managed Care – PPO

## 2017-07-13 DIAGNOSIS — J309 Allergic rhinitis, unspecified: Secondary | ICD-10-CM

## 2017-07-20 NOTE — Progress Notes (Signed)
Vials exp 07-21-18 

## 2017-07-22 DIAGNOSIS — J301 Allergic rhinitis due to pollen: Secondary | ICD-10-CM | POA: Diagnosis not present

## 2017-07-27 ENCOUNTER — Ambulatory Visit (INDEPENDENT_AMBULATORY_CARE_PROVIDER_SITE_OTHER): Payer: BC Managed Care – PPO | Admitting: *Deleted

## 2017-07-27 DIAGNOSIS — J309 Allergic rhinitis, unspecified: Secondary | ICD-10-CM

## 2017-08-10 ENCOUNTER — Ambulatory Visit (INDEPENDENT_AMBULATORY_CARE_PROVIDER_SITE_OTHER): Payer: BC Managed Care – PPO

## 2017-08-10 DIAGNOSIS — J309 Allergic rhinitis, unspecified: Secondary | ICD-10-CM

## 2017-08-31 ENCOUNTER — Ambulatory Visit (INDEPENDENT_AMBULATORY_CARE_PROVIDER_SITE_OTHER): Payer: BC Managed Care – PPO | Admitting: *Deleted

## 2017-08-31 DIAGNOSIS — J309 Allergic rhinitis, unspecified: Secondary | ICD-10-CM

## 2017-09-14 ENCOUNTER — Ambulatory Visit (INDEPENDENT_AMBULATORY_CARE_PROVIDER_SITE_OTHER): Payer: BC Managed Care – PPO

## 2017-09-14 ENCOUNTER — Telehealth: Payer: Self-pay

## 2017-09-14 DIAGNOSIS — J3089 Other allergic rhinitis: Secondary | ICD-10-CM

## 2017-09-14 DIAGNOSIS — J309 Allergic rhinitis, unspecified: Secondary | ICD-10-CM | POA: Diagnosis not present

## 2017-09-14 MED ORDER — AZELASTINE HCL 0.15 % NA SOLN: 2.0000 | Freq: Two times a day (BID) | NASAL | 5 refills | Status: DC

## 2017-09-14 NOTE — Telephone Encounter (Signed)
Antonio HuaDavid came in for his immunotherapy visit today. He requested a refill of azelastine nasal spray be sent in. That medication is not in his chart, but fluticasone nasal spray is. I left a message for him to call back to discuss.

## 2017-09-14 NOTE — Telephone Encounter (Signed)
Prescription has been "reordered" from his medication history.

## 2017-09-14 NOTE — Telephone Encounter (Signed)
Patient called back and stated that the azelastine helps more. He is still taking the fluticasone at night. He would like to know if we can send in a prescription for the azelastine.

## 2017-09-14 NOTE — Telephone Encounter (Signed)
He can definitely have the azelastine. He has been on this in the past.

## 2017-09-14 NOTE — Addendum Note (Signed)
Addended by: Mliss FritzBLACK, Leonora Gores I on: 09/14/2017 03:20 PM   Modules accepted: Orders

## 2017-09-28 ENCOUNTER — Ambulatory Visit (INDEPENDENT_AMBULATORY_CARE_PROVIDER_SITE_OTHER): Payer: BC Managed Care – PPO

## 2017-09-28 DIAGNOSIS — J309 Allergic rhinitis, unspecified: Secondary | ICD-10-CM

## 2017-10-05 ENCOUNTER — Ambulatory Visit (INDEPENDENT_AMBULATORY_CARE_PROVIDER_SITE_OTHER): Payer: BC Managed Care – PPO

## 2017-10-05 DIAGNOSIS — J309 Allergic rhinitis, unspecified: Secondary | ICD-10-CM

## 2017-10-11 ENCOUNTER — Encounter: Payer: Self-pay | Admitting: Allergy and Immunology

## 2017-10-11 ENCOUNTER — Ambulatory Visit: Payer: BC Managed Care – PPO | Admitting: Allergy and Immunology

## 2017-10-11 VITALS — BP 120/80 | HR 88 | Temp 98.0°F | Resp 16

## 2017-10-11 DIAGNOSIS — J3089 Other allergic rhinitis: Secondary | ICD-10-CM | POA: Diagnosis not present

## 2017-10-11 DIAGNOSIS — J4540 Moderate persistent asthma, uncomplicated: Secondary | ICD-10-CM | POA: Diagnosis not present

## 2017-10-11 MED ORDER — ALBUTEROL SULFATE HFA 108 (90 BASE) MCG/ACT IN AERS: INHALATION_SPRAY | RESPIRATORY_TRACT | 2 refills | Status: DC

## 2017-10-11 MED ORDER — BUDESONIDE-FORMOTEROL FUMARATE 80-4.5 MCG/ACT IN AERO: 2.0000 | INHALATION_SPRAY | Freq: Two times a day (BID) | RESPIRATORY_TRACT | 5 refills | Status: DC

## 2017-10-11 MED ORDER — EPINEPHRINE 0.3 MG/0.3ML IJ SOAJ: 0.3000 mg | Freq: Once | INTRAMUSCULAR | 1 refills | Status: AC

## 2017-10-11 MED ORDER — MONTELUKAST SODIUM 10 MG PO TABS: 10.0000 mg | ORAL_TABLET | Freq: Every day | ORAL | 5 refills | Status: DC

## 2017-10-11 NOTE — Assessment & Plan Note (Signed)
Well-controlled.  Continue Symbicort 80-4.5 g, 2 inhalations via spacer device twice a day, montelukast 10 mg daily bedtime, and albuterol HFA, 1-2 inhalations every 6 hours if needed.    Subjective and objective measures of pulmonary function will be followed and the treatment plan will be adjusted accordingly.

## 2017-10-11 NOTE — Progress Notes (Signed)
Follow-up Note  RE: Antonio Holmes MRN: [id number] DOB: 03-13-1961 Date of Office Visit: 10/11/2017  Primary care provider: Gweneth Dimitri, MD Referring provider: Gweneth Dimitri, MD  History of present illness: Antonio Holmes is a 57 y.o. male with persistent asthma, allergic rhinitis, and acid reflux presenting today for follow-up.  He was last seen in this clinic in February 2018.  He reports that his asthma has been well controlled in the interval since his previous visit.  The only time that he experiences asthma symptoms that is when he is "really exerting" himself.  He denies nocturnal awakenings due to lower respiratory symptoms.  He is currently taking Symbicort 80-4.5 g, 2 inhalations via spacer device, and montelukast 10 mg daily bedtime.  He reports that he discontinued Spiriva several weeks after starting because of constipation.  He has no nasal allergy symptom complaints today and is receiving aeroallergen immunotherapy injections without problems or complications.   Assessment and plan: Moderate persistent asthma Well-controlled.  Continue Symbicort 80-4.5 g, 2 inhalations via spacer device twice a day, montelukast 10 mg daily bedtime, and albuterol HFA, 1-2 inhalations every 6 hours if needed.    Subjective and objective measures of pulmonary function will be followed and the treatment plan will be adjusted accordingly.  Perennial and seasonal allergic rhinitis Stable.    Continue appropriate allergen avoidance measures, aeroallergen immunotherapy as prescribed and as tolerated, montelukast 10 mg daily, and fluticasone nasal spray as needed.  Medications will be decreased or discontinued as symptom relief from immunotherapy becomes evident.   Meds ordered this encounter  Medications  . budesonide-formoterol (SYMBICORT) 80-4.5 MCG/ACT inhaler    Sig: Inhale 2 puffs into the lungs 2 (two) times daily.    Dispense:  1 Inhaler    Refill:  5  . montelukast  (SINGULAIR) 10 MG tablet    Sig: Take 1 tablet (10 mg total) by mouth at bedtime.    Dispense:  30 tablet    Refill:  5    Patient will need OV for further refills.  Marland Kitchen albuterol (PROAIR HFA) 108 (90 Base) MCG/ACT inhaler    Sig: 2 puffs every 4 hours as needed only  if your can't catch your breath    Dispense:  1 Inhaler    Refill:  2  . EPINEPHrine (AUVI-Q) 0.3 mg/0.3 mL IJ SOAJ injection    Sig: Inject 0.3 mLs (0.3 mg total) into the muscle once for 1 dose.    Dispense:  2 Device    Refill:  1    Diagnostics: Spirometry reveals an FVC of 3.01 L and an FEV1 of 2.08 L, FEV1 ratio of 91%.  FEV1 is slightly improved compared with previous study.  Please see scanned spirometry results for details.    Physical examination: Blood pressure 120/80, pulse 88, temperature 98 F (36.7 C), temperature source Oral, resp. rate 16, SpO2 95 %.  General: Alert, interactive, in no acute distress. HEENT: TMs pearly gray, turbinates mildly edematous without discharge, post-pharynx unremarkable. Neck: Supple without lymphadenopathy. Lungs: Clear to auscultation without wheezing, rhonchi or rales. CV: Normal S1, S2 without murmurs. Skin: Warm and dry, without lesions or rashes.  The following portions of the patient's history were reviewed and updated as appropriate: allergies, current medications, past family history, past medical history, past social history, past surgical history and problem list.  Allergies as of 10/11/2017      Reactions   Tramadol    "made my skin crawl"  Medication List        Accurate as of 10/11/17  8:31 PM. Always use your most recent med list.          acetaminophen 325 MG tablet Commonly known as:  TYLENOL Take 650 mg by mouth every 6 (six) hours as needed.   albuterol 108 (90 Base) MCG/ACT inhaler Commonly known as:  PROAIR HFA 2 puffs every 4 hours as needed only  if your can't catch your breath   Azelastine HCl 0.15 % Soln Place 2 sprays into both  nostrils 2 (two) times daily.   budesonide-formoterol 80-4.5 MCG/ACT inhaler Commonly known as:  SYMBICORT Inhale 2 puffs into the lungs 2 (two) times daily.   EPINEPHrine 0.3 mg/0.3 mL Soaj injection Commonly known as:  EPI-PEN Inject 0.3 mLs (0.3 mg total) into the muscle once.   EPINEPHrine 0.3 mg/0.3 mL Soaj injection Commonly known as:  AUVI-Q Inject 0.3 mLs (0.3 mg total) into the muscle once for 1 dose.   fluticasone 50 MCG/ACT nasal spray Commonly known as:  FLONASE Place 2 sprays into both nostrils daily.   levocetirizine 5 MG tablet Commonly known as:  XYZAL Take 5 mg by mouth every evening.   montelukast 10 MG tablet Commonly known as:  SINGULAIR Take 1 tablet (10 mg total) by mouth at bedtime.   multivitamin capsule Take 1 capsule by mouth daily.   Tiotropium Bromide Monohydrate 1.25 MCG/ACT Aers Commonly known as:  SPIRIVA RESPIMAT Inhale 2 puffs into the lungs daily.       Allergies  Allergen Reactions  . Tramadol     "made my skin crawl"    I appreciate the opportunity to take part in [name]'s care. Please do not hesitate to contact me with questions.  Sincerely,   R. Jorene Guestarter Keianna Signer, MD

## 2017-10-11 NOTE — Patient Instructions (Signed)
Moderate persistent asthma Well-controlled.  Continue Symbicort 80-4.5 g, 2 inhalations via spacer device twice a day, montelukast 10 mg daily bedtime, and albuterol HFA, 1-2 inhalations every 6 hours if needed.    Subjective and objective measures of pulmonary function will be followed and the treatment plan will be adjusted accordingly.  Perennial and seasonal allergic rhinitis Stable.    Continue appropriate allergen avoidance measures, aeroallergen immunotherapy as prescribed and as tolerated, montelukast 10 mg daily, and fluticasone nasal spray as needed.  Medications will be decreased or discontinued as symptom relief from immunotherapy becomes evident.   Return in about 5 months (around 03/10/2018), or if symptoms worsen or fail to improve.

## 2017-10-11 NOTE — Assessment & Plan Note (Signed)
Stable.    Continue appropriate allergen avoidance measures, aeroallergen immunotherapy as prescribed and as tolerated, montelukast 10 mg daily, and fluticasone nasal spray as needed.  Medications will be decreased or discontinued as symptom relief from immunotherapy becomes evident. 

## 2017-10-12 ENCOUNTER — Ambulatory Visit (INDEPENDENT_AMBULATORY_CARE_PROVIDER_SITE_OTHER): Payer: BC Managed Care – PPO | Admitting: *Deleted

## 2017-10-12 DIAGNOSIS — J309 Allergic rhinitis, unspecified: Secondary | ICD-10-CM

## 2017-10-19 ENCOUNTER — Ambulatory Visit (INDEPENDENT_AMBULATORY_CARE_PROVIDER_SITE_OTHER): Payer: BC Managed Care – PPO

## 2017-10-19 DIAGNOSIS — J309 Allergic rhinitis, unspecified: Secondary | ICD-10-CM | POA: Diagnosis not present

## 2017-10-26 ENCOUNTER — Ambulatory Visit (INDEPENDENT_AMBULATORY_CARE_PROVIDER_SITE_OTHER): Payer: BC Managed Care – PPO | Admitting: *Deleted

## 2017-10-26 DIAGNOSIS — J309 Allergic rhinitis, unspecified: Secondary | ICD-10-CM

## 2017-11-09 ENCOUNTER — Ambulatory Visit (INDEPENDENT_AMBULATORY_CARE_PROVIDER_SITE_OTHER): Payer: BC Managed Care – PPO

## 2017-11-09 DIAGNOSIS — J309 Allergic rhinitis, unspecified: Secondary | ICD-10-CM | POA: Diagnosis not present

## 2017-11-23 ENCOUNTER — Ambulatory Visit (INDEPENDENT_AMBULATORY_CARE_PROVIDER_SITE_OTHER): Payer: BC Managed Care – PPO

## 2017-11-23 DIAGNOSIS — J309 Allergic rhinitis, unspecified: Secondary | ICD-10-CM

## 2017-12-07 ENCOUNTER — Ambulatory Visit (INDEPENDENT_AMBULATORY_CARE_PROVIDER_SITE_OTHER): Payer: BC Managed Care – PPO | Admitting: *Deleted

## 2017-12-07 DIAGNOSIS — J309 Allergic rhinitis, unspecified: Secondary | ICD-10-CM

## 2017-12-16 NOTE — Progress Notes (Signed)
Vials exp 12-17-18 

## 2017-12-17 ENCOUNTER — Encounter: Payer: Self-pay | Admitting: *Deleted

## 2017-12-17 DIAGNOSIS — J301 Allergic rhinitis due to pollen: Secondary | ICD-10-CM | POA: Diagnosis not present

## 2017-12-28 ENCOUNTER — Ambulatory Visit (INDEPENDENT_AMBULATORY_CARE_PROVIDER_SITE_OTHER): Payer: BC Managed Care – PPO | Admitting: *Deleted

## 2017-12-28 DIAGNOSIS — J309 Allergic rhinitis, unspecified: Secondary | ICD-10-CM | POA: Diagnosis not present

## 2018-01-11 ENCOUNTER — Ambulatory Visit (INDEPENDENT_AMBULATORY_CARE_PROVIDER_SITE_OTHER): Payer: BC Managed Care – PPO

## 2018-01-11 DIAGNOSIS — J309 Allergic rhinitis, unspecified: Secondary | ICD-10-CM

## 2018-01-25 ENCOUNTER — Ambulatory Visit (INDEPENDENT_AMBULATORY_CARE_PROVIDER_SITE_OTHER): Payer: BC Managed Care – PPO | Admitting: *Deleted

## 2018-01-25 DIAGNOSIS — J309 Allergic rhinitis, unspecified: Secondary | ICD-10-CM | POA: Diagnosis not present

## 2018-02-08 ENCOUNTER — Ambulatory Visit (INDEPENDENT_AMBULATORY_CARE_PROVIDER_SITE_OTHER): Payer: BC Managed Care – PPO

## 2018-02-08 DIAGNOSIS — J309 Allergic rhinitis, unspecified: Secondary | ICD-10-CM

## 2018-02-15 ENCOUNTER — Ambulatory Visit (INDEPENDENT_AMBULATORY_CARE_PROVIDER_SITE_OTHER): Payer: BC Managed Care – PPO

## 2018-02-15 DIAGNOSIS — J309 Allergic rhinitis, unspecified: Secondary | ICD-10-CM

## 2018-02-22 ENCOUNTER — Ambulatory Visit (INDEPENDENT_AMBULATORY_CARE_PROVIDER_SITE_OTHER): Payer: BC Managed Care – PPO | Admitting: *Deleted

## 2018-02-22 DIAGNOSIS — J309 Allergic rhinitis, unspecified: Secondary | ICD-10-CM

## 2018-03-01 ENCOUNTER — Ambulatory Visit (INDEPENDENT_AMBULATORY_CARE_PROVIDER_SITE_OTHER): Payer: BC Managed Care – PPO

## 2018-03-01 DIAGNOSIS — J309 Allergic rhinitis, unspecified: Secondary | ICD-10-CM

## 2018-03-08 ENCOUNTER — Ambulatory Visit (INDEPENDENT_AMBULATORY_CARE_PROVIDER_SITE_OTHER): Payer: BC Managed Care – PPO

## 2018-03-08 DIAGNOSIS — J309 Allergic rhinitis, unspecified: Secondary | ICD-10-CM

## 2018-03-22 ENCOUNTER — Ambulatory Visit (INDEPENDENT_AMBULATORY_CARE_PROVIDER_SITE_OTHER): Payer: BC Managed Care – PPO

## 2018-03-22 DIAGNOSIS — J309 Allergic rhinitis, unspecified: Secondary | ICD-10-CM

## 2018-04-05 ENCOUNTER — Ambulatory Visit (INDEPENDENT_AMBULATORY_CARE_PROVIDER_SITE_OTHER): Payer: BC Managed Care – PPO | Admitting: *Deleted

## 2018-04-05 DIAGNOSIS — J309 Allergic rhinitis, unspecified: Secondary | ICD-10-CM | POA: Diagnosis not present

## 2018-04-07 DIAGNOSIS — J3089 Other allergic rhinitis: Secondary | ICD-10-CM

## 2018-04-07 DIAGNOSIS — J4540 Moderate persistent asthma, uncomplicated: Secondary | ICD-10-CM

## 2018-04-07 MED ORDER — MONTELUKAST SODIUM 10 MG PO TABS: 10.0000 mg | ORAL_TABLET | Freq: Every day | ORAL | 0 refills | Status: DC

## 2018-04-13 ENCOUNTER — Encounter: Payer: Self-pay | Admitting: *Deleted

## 2018-04-13 DIAGNOSIS — J301 Allergic rhinitis due to pollen: Secondary | ICD-10-CM

## 2018-04-13 NOTE — Progress Notes (Signed)
Vials made. Exp: 04-14-19. hv 

## 2018-04-19 ENCOUNTER — Ambulatory Visit (INDEPENDENT_AMBULATORY_CARE_PROVIDER_SITE_OTHER): Payer: BC Managed Care – PPO | Admitting: *Deleted

## 2018-04-19 DIAGNOSIS — J309 Allergic rhinitis, unspecified: Secondary | ICD-10-CM

## 2018-04-26 ENCOUNTER — Ambulatory Visit: Payer: BC Managed Care – PPO | Admitting: Allergy and Immunology

## 2018-04-26 ENCOUNTER — Encounter: Payer: Self-pay | Admitting: Allergy and Immunology

## 2018-04-26 VITALS — BP 108/76 | HR 93 | Resp 16 | Ht 63.0 in | Wt 190.6 lb

## 2018-04-26 DIAGNOSIS — R053 Chronic cough: Secondary | ICD-10-CM

## 2018-04-26 DIAGNOSIS — J4540 Moderate persistent asthma, uncomplicated: Secondary | ICD-10-CM

## 2018-04-26 DIAGNOSIS — J3089 Other allergic rhinitis: Secondary | ICD-10-CM | POA: Diagnosis not present

## 2018-04-26 DIAGNOSIS — R05 Cough: Secondary | ICD-10-CM

## 2018-04-26 MED ORDER — ALBUTEROL SULFATE HFA 108 (90 BASE) MCG/ACT IN AERS: INHALATION_SPRAY | RESPIRATORY_TRACT | 1 refills | Status: DC

## 2018-04-26 MED ORDER — AZELASTINE HCL 0.15 % NA SOLN: 1.0000 | Freq: Two times a day (BID) | NASAL | 5 refills | Status: DC | PRN

## 2018-04-26 MED ORDER — ALBUTEROL SULFATE HFA 108 (90 BASE) MCG/ACT IN AERS: INHALATION_SPRAY | RESPIRATORY_TRACT | 2 refills | Status: DC

## 2018-04-26 MED ORDER — FLUTICASONE PROPIONATE 50 MCG/ACT NA SUSP: 2.0000 | Freq: Every day | NASAL | 5 refills | Status: DC

## 2018-04-26 MED ORDER — BUDESONIDE-FORMOTEROL FUMARATE 160-4.5 MCG/ACT IN AERO: 2.0000 | INHALATION_SPRAY | Freq: Two times a day (BID) | RESPIRATORY_TRACT | 5 refills | Status: DC

## 2018-04-26 MED ORDER — MONTELUKAST SODIUM 10 MG PO TABS: 10.0000 mg | ORAL_TABLET | Freq: Every day | ORAL | 5 refills | Status: DC

## 2018-04-26 NOTE — Assessment & Plan Note (Signed)
Stable.    Continue appropriate allergen avoidance measures, aeroallergen immunotherapy as prescribed and as tolerated, montelukast 10 mg daily, and fluticasone nasal spray as needed.  Medications will be decreased or discontinued as symptom relief from immunotherapy becomes evident.

## 2018-04-26 NOTE — Assessment & Plan Note (Addendum)
Currently with suboptimal control.  In addition, todays spirometry results, assessed while asymptomatic, suggest under-perception of bronchoconstriction.  We will step up therapy at this time.  A sample and prescription have been provided for Symbicort 160- 4.5 g, 2 inhalations via spacer device twice daily.  Discontinue Symbicort 80 g.  Continue montelukast 10 mg daily at bedtime and albuterol HFA, 1 to 2 inhalations every 4-6 hours if needed.  I have also recommended using albuterol 10 to 15 minutes prior to strenuous/vigorous activity.  The patient has been asked to contact me if his symptoms persist or progress. Otherwise, he may return for follow up in 4 months.

## 2018-04-26 NOTE — Patient Instructions (Addendum)
Moderate persistent asthma Currently with suboptimal control.  In addition, todays spirometry results, assessed while asymptomatic, suggest under-perception of bronchoconstriction.  We will step up therapy at this time.  A sample and prescription have been provided for Symbicort 160- 4.5 g, 2 inhalations via spacer device twice daily.  Discontinue Symbicort 80 g.  Continue montelukast 10 mg daily at bedtime and albuterol HFA, 1 to 2 inhalations every 4-6 hours if needed.  I have also recommended using albuterol 10 to 15 minutes prior to strenuous/vigorous activity.  The patient has been asked to contact me if his symptoms persist or progress. Otherwise, he may return for follow up in 4 months.  Perennial and seasonal allergic rhinitis Stable.    Continue appropriate allergen avoidance measures, aeroallergen immunotherapy as prescribed and as tolerated, montelukast 10 mg daily, and fluticasone nasal spray as needed.  Medications will be decreased or discontinued as symptom relief from immunotherapy becomes evident.   Return in about 4 months (around 08/26/2018), or if symptoms worsen or fail to improve.

## 2018-04-26 NOTE — Progress Notes (Signed)
Follow-up Note  RE: Antonio Holmes MRN: [id number] DOB: [birth year] Date of Office Visit: 04/26/2018  Primary care provider: Gweneth Dimitri, MD Referring provider: Gweneth Dimitri, MD  History of present illness: Antonio Holmes is a 57 y.o. male with persistent asthma and allergic rhinitis presenting today for follow-up.  He was last seen in this clinic on October 11, 2017.  He reports that he has recently been experiencing asthma symptoms more frequently, particularly with the physically demanding work he has been doing at his job recently.  He cuts and processes firewood and has been needing to stop for albuterol rescue 4 or 5 times per day. He does not use albuterol prior to physical exertion.  He rarely requires albuterol rescue when he is not at work.  He does not experience nocturnal awakenings due to lower respiratory symptoms.  The persistent cough which had previously troubled him has resolved.   Duglas has no nasal allergy symptom complaints or reflux related complaints today.  Assessment and plan: Moderate persistent asthma Currently with suboptimal control.  In addition, todays spirometry results, assessed while asymptomatic, suggest under-perception of bronchoconstriction.  We will step up therapy at this time.  A sample and prescription have been provided for Symbicort 160- 4.5 g, 2 inhalations via spacer device twice daily.  Discontinue Symbicort 80 g.  Continue montelukast 10 mg daily at bedtime and albuterol HFA, 1 to 2 inhalations every 4-6 hours if needed.  I have also recommended using albuterol 10 to 15 minutes prior to strenuous/vigorous activity.  The patient has been asked to contact me if his symptoms persist or progress. Otherwise, he may return for follow up in 4 months.  Perennial and seasonal allergic rhinitis Stable.    Continue appropriate allergen avoidance measures, aeroallergen immunotherapy as prescribed and as tolerated, montelukast 10 mg daily,  and fluticasone nasal spray as needed.  Medications will be decreased or discontinued as symptom relief from immunotherapy becomes evident.   Meds ordered this encounter  Medications  . budesonide-formoterol (SYMBICORT) 160-4.5 MCG/ACT inhaler    Sig: Inhale 2 puffs into the lungs 2 (two) times daily.    Dispense:  1 Inhaler    Refill:  5  . DISCONTD: albuterol (PROAIR HFA) 108 (90 Base) MCG/ACT inhaler    Sig: 2 puffs every 4 hours as needed only  if your can't catch your breath    Dispense:  1 Inhaler    Refill:  2  . Azelastine HCl 0.15 % SOLN    Sig: Place 1-2 sprays into both nostrils 2 (two) times daily as needed.    Dispense:  30 mL    Refill:  5  . fluticasone (FLONASE) 50 MCG/ACT nasal spray    Sig: Place 2 sprays into both nostrils daily.    Dispense:  16 g    Refill:  5  . montelukast (SINGULAIR) 10 MG tablet    Sig: Take 1 tablet (10 mg total) by mouth at bedtime.    Dispense:  30 tablet    Refill:  5    Patient will need OV for further refills.  Marland Kitchen albuterol (PROAIR HFA) 108 (90 Base) MCG/ACT inhaler    Sig: 2 puffs every 4 hours as needed only  if your can't catch your breath    Dispense:  1 Inhaler    Refill:  1    Diagnostics: Prematurity reveals an FVC of 2.95 L (80% predicted) and an FEV1 of 1.90 L (67% predicted) with significant (380 mL,  20%) post bronchodilator improvement.  This study was performed while the patient was asymptomatic.  Please see scanned spirometry results for details.    Physical examination: Blood pressure 108/76, pulse 93, resp. rate 16, height 5\' 3"  (1.6 m), weight 190 lb 9.6 oz (86.5 kg), SpO2 97 %.  General: Alert, interactive, in no acute distress. HEENT: TMs pearly gray, turbinates mildly edematous without discharge, post-pharynx mildly erythematous. Neck: Supple without lymphadenopathy. Lungs: Clear to auscultation without wheezing, rhonchi or rales. CV: Normal S1, S2 without murmurs. Skin: Warm and dry, without lesions or  rashes.  The following portions of the patient's history were reviewed and updated as appropriate: allergies, current medications, past family history, past medical history, past social history, past surgical history and problem list.  Allergies as of 04/26/2018      Reactions   Tramadol    "made my skin crawl"      Medication List        Accurate as of 04/26/18  9:16 PM. Always use your most recent med list.          acetaminophen 325 MG tablet Commonly known as:  TYLENOL Take 650 mg by mouth every 6 (six) hours as needed.   albuterol 108 (90 Base) MCG/ACT inhaler Commonly known as:  PROVENTIL HFA;VENTOLIN HFA 2 puffs every 4 hours as needed only  if your can't catch your breath   Azelastine HCl 0.15 % Soln Place 1-2 sprays into both nostrils 2 (two) times daily as needed.   budesonide-formoterol 160-4.5 MCG/ACT inhaler Commonly known as:  SYMBICORT Inhale 2 puffs into the lungs 2 (two) times daily.   EPINEPHrine 0.3 mg/0.3 mL Soaj injection Commonly known as:  EPI-PEN Inject 0.3 mLs (0.3 mg total) into the muscle once.   fluticasone 50 MCG/ACT nasal spray Commonly known as:  FLONASE Place 2 sprays into both nostrils daily.   levocetirizine 5 MG tablet Commonly known as:  XYZAL Take 5 mg by mouth every evening.   montelukast 10 MG tablet Commonly known as:  SINGULAIR Take 1 tablet (10 mg total) by mouth at bedtime.       Allergies  Allergen Reactions  . Tramadol     "made my skin crawl"   Review of systems: Review of systems negative except as noted in HPI / PMHx or noted below: Constitutional: Negative.  HENT: Negative.   Eyes: Negative.  Respiratory: Negative.   Cardiovascular: Negative.  Gastrointestinal: Negative.  Genitourinary: Negative.  Musculoskeletal: Negative.  Neurological: Negative.  Endo/Heme/Allergies: Negative.  Cutaneous: Negative.  Past Medical History:  Diagnosis Date  . Asthma   . Cough     Family History  Problem Relation  Age of Onset  . Hypertension Father   . Allergic rhinitis Neg Hx   . Angioedema Neg Hx   . Asthma Neg Hx   . Atopy Neg Hx   . Eczema Neg Hx   . Immunodeficiency Neg Hx   . Urticaria Neg Hx     Social History   Socioeconomic History  . Marital status: Married    Spouse name: Not on file  . Number of children: Not on file  . Years of education: Not on file  . Highest education level: Not on file  Occupational History  . Occupation: Operates Probation officer  . Financial resource strain: Not on file  . Food insecurity:    Worry: Not on file    Inability: Not on file  . Transportation needs:    Medical:  Not on file    Non-medical: Not on file  Tobacco Use  . Smoking status: Former Smoker    Packs/day: 1.00    Years: 0.50    Pack years: 0.50    Types: Cigarettes    Last attempt to quit: 08/24/1992    Years since quitting: 25.6  . Smokeless tobacco: Never Used  Substance and Sexual Activity  . Alcohol use: No    Alcohol/week: 0.0 standard drinks  . Drug use: No  . Sexual activity: Not on file  Lifestyle  . Physical activity:    Days per week: Not on file    Minutes per session: Not on file  . Stress: Not on file  Relationships  . Social connections:    Talks on phone: Not on file    Gets together: Not on file    Attends religious service: Not on file    Active member of club or organization: Not on file    Attends meetings of clubs or organizations: Not on file    Relationship status: Not on file  . Intimate partner violence:    Fear of current or ex partner: Not on file    Emotionally abused: Not on file    Physically abused: Not on file    Forced sexual activity: Not on file  Other Topics Concern  . Not on file  Social History Narrative  . Not on file    I appreciate the opportunity to take part in [place]'s care. Please do not hesitate to contact me with questions.  Sincerely,   R. Jorene Guest, MD

## 2018-05-03 ENCOUNTER — Ambulatory Visit (INDEPENDENT_AMBULATORY_CARE_PROVIDER_SITE_OTHER): Payer: BC Managed Care – PPO | Admitting: *Deleted

## 2018-05-03 DIAGNOSIS — J309 Allergic rhinitis, unspecified: Secondary | ICD-10-CM | POA: Diagnosis not present

## 2018-05-11 MED ORDER — MONTELUKAST SODIUM 10 MG PO TABS: 10.0000 mg | ORAL_TABLET | Freq: Every day | ORAL | 5 refills | Status: DC

## 2018-05-17 ENCOUNTER — Ambulatory Visit (INDEPENDENT_AMBULATORY_CARE_PROVIDER_SITE_OTHER): Payer: BC Managed Care – PPO

## 2018-05-17 DIAGNOSIS — J309 Allergic rhinitis, unspecified: Secondary | ICD-10-CM | POA: Diagnosis not present

## 2018-05-31 ENCOUNTER — Ambulatory Visit (INDEPENDENT_AMBULATORY_CARE_PROVIDER_SITE_OTHER): Payer: BC Managed Care – PPO

## 2018-05-31 DIAGNOSIS — J309 Allergic rhinitis, unspecified: Secondary | ICD-10-CM

## 2018-06-08 ENCOUNTER — Ambulatory Visit (INDEPENDENT_AMBULATORY_CARE_PROVIDER_SITE_OTHER): Payer: BC Managed Care – PPO | Admitting: *Deleted

## 2018-06-08 DIAGNOSIS — J309 Allergic rhinitis, unspecified: Secondary | ICD-10-CM

## 2018-06-15 ENCOUNTER — Ambulatory Visit (INDEPENDENT_AMBULATORY_CARE_PROVIDER_SITE_OTHER): Payer: BC Managed Care – PPO

## 2018-06-15 DIAGNOSIS — J309 Allergic rhinitis, unspecified: Secondary | ICD-10-CM | POA: Diagnosis not present

## 2018-06-24 ENCOUNTER — Ambulatory Visit (INDEPENDENT_AMBULATORY_CARE_PROVIDER_SITE_OTHER): Payer: BC Managed Care – PPO

## 2018-06-24 DIAGNOSIS — J309 Allergic rhinitis, unspecified: Secondary | ICD-10-CM | POA: Diagnosis not present

## 2018-07-01 ENCOUNTER — Ambulatory Visit (INDEPENDENT_AMBULATORY_CARE_PROVIDER_SITE_OTHER): Payer: BC Managed Care – PPO

## 2018-07-01 DIAGNOSIS — J309 Allergic rhinitis, unspecified: Secondary | ICD-10-CM

## 2018-07-13 ENCOUNTER — Ambulatory Visit (INDEPENDENT_AMBULATORY_CARE_PROVIDER_SITE_OTHER): Payer: BC Managed Care – PPO

## 2018-07-13 DIAGNOSIS — J309 Allergic rhinitis, unspecified: Secondary | ICD-10-CM

## 2018-07-27 ENCOUNTER — Ambulatory Visit (INDEPENDENT_AMBULATORY_CARE_PROVIDER_SITE_OTHER): Payer: BC Managed Care – PPO

## 2018-07-27 DIAGNOSIS — J309 Allergic rhinitis, unspecified: Secondary | ICD-10-CM

## 2018-08-03 DIAGNOSIS — J301 Allergic rhinitis due to pollen: Secondary | ICD-10-CM

## 2018-08-10 ENCOUNTER — Ambulatory Visit (INDEPENDENT_AMBULATORY_CARE_PROVIDER_SITE_OTHER): Payer: BC Managed Care – PPO | Admitting: *Deleted

## 2018-08-10 DIAGNOSIS — J309 Allergic rhinitis, unspecified: Secondary | ICD-10-CM | POA: Diagnosis not present

## 2018-08-26 ENCOUNTER — Ambulatory Visit (INDEPENDENT_AMBULATORY_CARE_PROVIDER_SITE_OTHER): Payer: BC Managed Care – PPO

## 2018-08-26 DIAGNOSIS — J309 Allergic rhinitis, unspecified: Secondary | ICD-10-CM

## 2018-08-30 ENCOUNTER — Ambulatory Visit: Payer: BC Managed Care – PPO | Admitting: Allergy and Immunology

## 2018-08-30 ENCOUNTER — Encounter: Payer: Self-pay | Admitting: Allergy and Immunology

## 2018-08-30 VITALS — BP 104/82 | HR 79 | Resp 18

## 2018-08-30 DIAGNOSIS — J3089 Other allergic rhinitis: Secondary | ICD-10-CM | POA: Diagnosis not present

## 2018-08-30 DIAGNOSIS — J4540 Moderate persistent asthma, uncomplicated: Secondary | ICD-10-CM | POA: Diagnosis not present

## 2018-08-30 MED ORDER — BUDESONIDE-FORMOTEROL FUMARATE 160-4.5 MCG/ACT IN AERO: 2.0000 | INHALATION_SPRAY | Freq: Two times a day (BID) | RESPIRATORY_TRACT | 5 refills | Status: DC

## 2018-08-30 MED ORDER — MONTELUKAST SODIUM 10 MG PO TABS: 10.0000 mg | ORAL_TABLET | Freq: Every day | ORAL | 5 refills | Status: DC

## 2018-08-30 NOTE — Assessment & Plan Note (Signed)
Stable.  Continue Symbicort 160-4.5 g, 2 inhalations via spacer device twice daily, montelukast 10 mg daily at bedtime, and albuterol HFA, 1 to 2 inhalations every 4-6 hours as needed and 15 minutes prior to exercise or vigorous exertion.  Refill prescriptions have been provided for Symbicort and montelukast.  Subjective and objective measures of pulmonary function will be followed and the treatment plan will be adjusted accordingly.

## 2018-08-30 NOTE — Progress Notes (Signed)
Follow-up Note  RE: Antonio Holmes MRN: [id number] DOB: [birth year] Date of Office Visit: 08/30/2018  Primary care provider: Gweneth Dimitri, MD Referring provider: Gweneth Dimitri, MD  History of present illness: Antonio Holmes is a 57 y.o. male with persistent asthma and allergic rhinitis presenting today for follow-up.  He was last seen in this clinic in September 2019.  He reports that in the interval since his previous visit his asthma has been well controlled.  He requires albuterol rescue 1 time per week on average, rarely experiences limitations in daily activities, and denies nocturnal awakenings due to lower respiratory symptoms.  His nasal allergy symptoms are well controlled with as needed fluticasone nasal spray and/or azelastine nasal spray.  He has been receiving aeroallergen immunotherapy injections without problems or complications.  Assessment and plan: Moderate persistent asthma Stable.  Continue Symbicort 160-4.5 g, 2 inhalations via spacer device twice daily, montelukast 10 mg daily at bedtime, and albuterol HFA, 1 to 2 inhalations every 4-6 hours as needed and 15 minutes prior to exercise or vigorous exertion.  Refill prescriptions have been provided for Symbicort and montelukast.  Subjective and objective measures of pulmonary function will be followed and the treatment plan will be adjusted accordingly.  Perennial and seasonal allergic rhinitis Well-controlled  Continue appropriate allergen avoidance measures, aeroallergen immunotherapy as prescribed and as tolerated, montelukast 10 mg daily, and fluticasone nasal spray as needed.  Nasal saline spray (i.e., Simply Saline) or nasal saline lavage (i.e., NeilMed) is recommended as needed and prior to medicated nasal sprays.  Medications will be decreased or discontinued as symptom relief from immunotherapy becomes evident.   Meds ordered this encounter  Medications  . budesonide-formoterol (SYMBICORT)  160-4.5 MCG/ACT inhaler    Sig: Inhale 2 puffs into the lungs 2 (two) times daily.    Dispense:  1 Inhaler    Refill:  5  . montelukast (SINGULAIR) 10 MG tablet    Sig: Take 1 tablet (10 mg total) by mouth at bedtime.    Dispense:  30 tablet    Refill:  5    Diagnostics: Monitor reveals an FVC of 3.08 L and an FEV1 of 2.17 L (73% predicted) with an FEV1 ratio of 92%.  Please see scanned spirometry results for details.    Physical examination: Blood pressure 104/82, pulse 79, resp. rate 18, SpO2 98 %.  General: Alert, interactive, in no acute distress. HEENT: TMs pearly gray, turbinates mildly edematous without discharge, post-pharynx unremarkable. Neck: Supple without lymphadenopathy. Lungs: Clear to auscultation without wheezing, rhonchi or rales. CV: Normal S1, S2 without murmurs. Skin: Warm and dry, without lesions or rashes.  The following portions of the patient's history were reviewed and updated as appropriate: allergies, current medications, past family history, past medical history, past social history, past surgical history and problem list.  Allergies as of 08/30/2018      Reactions   Tramadol    "made my skin crawl"      Medication List       Accurate as of August 30, 2018 10:09 AM. Always use your most recent med list.        acetaminophen 325 MG tablet Commonly known as:  TYLENOL Take 650 mg by mouth every 6 (six) hours as needed.   albuterol 108 (90 Base) MCG/ACT inhaler Commonly known as:  PROAIR HFA 2 puffs every 4 hours as needed only  if your can't catch your breath   Azelastine HCl 0.15 % Soln Place 1-2 sprays into  both nostrils 2 (two) times daily as needed.   budesonide-formoterol 160-4.5 MCG/ACT inhaler Commonly known as:  SYMBICORT Inhale 2 puffs into the lungs 2 (two) times daily.   EPINEPHrine 0.3 mg/0.3 mL Soaj injection Commonly known as:  EPI-PEN Inject 0.3 mLs (0.3 mg total) into the muscle once.   fluticasone 50 MCG/ACT nasal  spray Commonly known as:  FLONASE Place 2 sprays into both nostrils daily.   levocetirizine 5 MG tablet Commonly known as:  XYZAL Take 5 mg by mouth every evening.   montelukast 10 MG tablet Commonly known as:  SINGULAIR Take 1 tablet (10 mg total) by mouth at bedtime.       Allergies  Allergen Reactions  . Tramadol     "made my skin crawl"    I appreciate the opportunity to take part in [place]'s care. Please do not hesitate to contact me with questions.  Sincerely,   R. Jorene Guest, MD

## 2018-08-30 NOTE — Patient Instructions (Addendum)
Moderate persistent asthma Stable.  Continue Symbicort 160-4.5 g, 2 inhalations via spacer device twice daily, montelukast 10 mg daily at bedtime, and albuterol HFA, 1 to 2 inhalations every 4-6 hours as needed and 15 minutes prior to exercise or vigorous exertion.  Refill prescriptions have been provided for Symbicort and montelukast.  Subjective and objective measures of pulmonary function will be followed and the treatment plan will be adjusted accordingly.  Perennial and seasonal allergic rhinitis Well-controlled  Continue appropriate allergen avoidance measures, aeroallergen immunotherapy as prescribed and as tolerated, montelukast 10 mg daily, and fluticasone nasal spray as needed.  Nasal saline spray (i.e., Simply Saline) or nasal saline lavage (i.e., NeilMed) is recommended as needed and prior to medicated nasal sprays.  Medications will be decreased or discontinued as symptom relief from immunotherapy becomes evident.   Return in about 6 months (around 02/28/2019), or if symptoms worsen or fail to improve.

## 2018-08-30 NOTE — Assessment & Plan Note (Signed)
Well-controlled  Continue appropriate allergen avoidance measures, aeroallergen immunotherapy as prescribed and as tolerated, montelukast 10 mg daily, and fluticasone nasal spray as needed.  Nasal saline spray (i.e., Simply Saline) or nasal saline lavage (i.e., NeilMed) is recommended as needed and prior to medicated nasal sprays.  Medications will be decreased or discontinued as symptom relief from immunotherapy becomes evident.

## 2018-09-07 ENCOUNTER — Ambulatory Visit (INDEPENDENT_AMBULATORY_CARE_PROVIDER_SITE_OTHER): Payer: BC Managed Care – PPO

## 2018-09-07 DIAGNOSIS — J309 Allergic rhinitis, unspecified: Secondary | ICD-10-CM

## 2018-09-28 ENCOUNTER — Ambulatory Visit (INDEPENDENT_AMBULATORY_CARE_PROVIDER_SITE_OTHER): Payer: BC Managed Care – PPO

## 2018-09-28 DIAGNOSIS — J309 Allergic rhinitis, unspecified: Secondary | ICD-10-CM

## 2018-10-12 ENCOUNTER — Ambulatory Visit (INDEPENDENT_AMBULATORY_CARE_PROVIDER_SITE_OTHER): Payer: BC Managed Care – PPO

## 2018-10-12 DIAGNOSIS — J309 Allergic rhinitis, unspecified: Secondary | ICD-10-CM

## 2018-10-19 ENCOUNTER — Ambulatory Visit (INDEPENDENT_AMBULATORY_CARE_PROVIDER_SITE_OTHER): Payer: BC Managed Care – PPO | Admitting: *Deleted

## 2018-10-19 DIAGNOSIS — J309 Allergic rhinitis, unspecified: Secondary | ICD-10-CM

## 2018-11-02 ENCOUNTER — Ambulatory Visit (INDEPENDENT_AMBULATORY_CARE_PROVIDER_SITE_OTHER): Payer: BC Managed Care – PPO

## 2018-11-02 DIAGNOSIS — J309 Allergic rhinitis, unspecified: Secondary | ICD-10-CM

## 2018-11-09 ENCOUNTER — Ambulatory Visit (INDEPENDENT_AMBULATORY_CARE_PROVIDER_SITE_OTHER): Payer: BC Managed Care – PPO | Admitting: *Deleted

## 2018-11-09 DIAGNOSIS — J309 Allergic rhinitis, unspecified: Secondary | ICD-10-CM | POA: Diagnosis not present

## 2018-11-16 ENCOUNTER — Ambulatory Visit (INDEPENDENT_AMBULATORY_CARE_PROVIDER_SITE_OTHER): Payer: BC Managed Care – PPO | Admitting: *Deleted

## 2018-11-16 DIAGNOSIS — J309 Allergic rhinitis, unspecified: Secondary | ICD-10-CM | POA: Diagnosis not present

## 2018-11-23 ENCOUNTER — Ambulatory Visit (INDEPENDENT_AMBULATORY_CARE_PROVIDER_SITE_OTHER): Payer: BC Managed Care – PPO

## 2018-11-23 DIAGNOSIS — J309 Allergic rhinitis, unspecified: Secondary | ICD-10-CM | POA: Diagnosis not present

## 2018-12-14 ENCOUNTER — Ambulatory Visit (INDEPENDENT_AMBULATORY_CARE_PROVIDER_SITE_OTHER): Payer: BC Managed Care – PPO

## 2018-12-14 DIAGNOSIS — J309 Allergic rhinitis, unspecified: Secondary | ICD-10-CM

## 2019-01-04 ENCOUNTER — Ambulatory Visit (INDEPENDENT_AMBULATORY_CARE_PROVIDER_SITE_OTHER): Payer: BC Managed Care – PPO

## 2019-01-04 DIAGNOSIS — J309 Allergic rhinitis, unspecified: Secondary | ICD-10-CM

## 2019-01-25 ENCOUNTER — Ambulatory Visit (INDEPENDENT_AMBULATORY_CARE_PROVIDER_SITE_OTHER): Payer: BC Managed Care – PPO

## 2019-01-25 DIAGNOSIS — J309 Allergic rhinitis, unspecified: Secondary | ICD-10-CM

## 2019-02-15 ENCOUNTER — Ambulatory Visit (INDEPENDENT_AMBULATORY_CARE_PROVIDER_SITE_OTHER): Payer: BC Managed Care – PPO

## 2019-02-15 DIAGNOSIS — J309 Allergic rhinitis, unspecified: Secondary | ICD-10-CM

## 2019-02-28 ENCOUNTER — Encounter: Payer: Self-pay | Admitting: Allergy and Immunology

## 2019-02-28 ENCOUNTER — Other Ambulatory Visit: Payer: Self-pay

## 2019-02-28 ENCOUNTER — Ambulatory Visit: Payer: BC Managed Care – PPO | Admitting: Allergy and Immunology

## 2019-02-28 VITALS — BP 102/70 | HR 80 | Temp 97.5°F | Resp 16 | Ht 63.0 in

## 2019-02-28 DIAGNOSIS — J4540 Moderate persistent asthma, uncomplicated: Secondary | ICD-10-CM

## 2019-02-28 DIAGNOSIS — J3089 Other allergic rhinitis: Secondary | ICD-10-CM

## 2019-02-28 DIAGNOSIS — H1013 Acute atopic conjunctivitis, bilateral: Secondary | ICD-10-CM | POA: Diagnosis not present

## 2019-02-28 DIAGNOSIS — H1010 Acute atopic conjunctivitis, unspecified eye: Secondary | ICD-10-CM | POA: Insufficient documentation

## 2019-02-28 MED ORDER — BUDESONIDE-FORMOTEROL FUMARATE 160-4.5 MCG/ACT IN AERO: 2.0000 | INHALATION_SPRAY | Freq: Two times a day (BID) | RESPIRATORY_TRACT | 5 refills | Status: DC

## 2019-02-28 MED ORDER — MONTELUKAST SODIUM 10 MG PO TABS: 10.0000 mg | ORAL_TABLET | Freq: Every day | ORAL | 5 refills | Status: DC

## 2019-02-28 MED ORDER — EPINEPHRINE 0.3 MG/0.3ML IJ SOAJ: 0.3000 mg | Freq: Once | INTRAMUSCULAR | 1 refills | Status: AC

## 2019-02-28 MED ORDER — ALBUTEROL SULFATE HFA 108 (90 BASE) MCG/ACT IN AERS: INHALATION_SPRAY | RESPIRATORY_TRACT | 1 refills | Status: DC

## 2019-02-28 MED ORDER — FLUTICASONE PROPIONATE 50 MCG/ACT NA SUSP: 2.0000 | Freq: Every day | NASAL | 5 refills | Status: DC

## 2019-02-28 NOTE — Assessment & Plan Note (Signed)
Stable.  Continue Symbicort 160-4.5 g, 2 inhalations via spacer device twice daily, montelukast 10 mg daily at bedtime, and albuterol HFA, 1 to 2 inhalations every 4-6 hours as needed and 15 minutes prior to exercise or vigorous exertion.  Refill prescriptions have been provided for Symbicort and montelukast.  The montelukast boxed warning has been discussed and the patient has verbalized understanding.  Subjective and objective measures of pulmonary function will be followed and the treatment plan will be adjusted accordingly.

## 2019-02-28 NOTE — Patient Instructions (Addendum)
Moderate persistent asthma Stable.  Continue Symbicort 160-4.5 g, 2 inhalations via spacer device twice daily, montelukast 10 mg daily at bedtime, and albuterol HFA, 1 to 2 inhalations every 4-6 hours as needed and 15 minutes prior to exercise or vigorous exertion.  Refill prescriptions have been provided for Symbicort and montelukast.  The montelukast boxed warning has been discussed and the patient has verbalized understanding.  Subjective and objective measures of pulmonary function will be followed and the treatment plan will be adjusted accordingly.  Perennial and seasonal allergic rhinitis Stable.   Continue appropriate allergen avoidance measures, aeroallergen immunotherapy as prescribed and as tolerated, montelukast 10 mg daily, and fluticasone nasal spray as needed.  Nasal saline spray (i.e., Simply Saline) or nasal saline lavage (i.e., NeilMed) is recommended as needed and prior to medicated nasal sprays.  Medications will be decreased or discontinued as symptom relief from immunotherapy becomes evident.   Return in about 4 months (around 07/01/2019), or if symptoms worsen or fail to improve.

## 2019-02-28 NOTE — Assessment & Plan Note (Signed)
Stable.   Continue appropriate allergen avoidance measures, aeroallergen immunotherapy as prescribed and as tolerated, montelukast 10 mg daily, and fluticasone nasal spray as needed.  Nasal saline spray (i.e., Simply Saline) or nasal saline lavage (i.e., NeilMed) is recommended as needed and prior to medicated nasal sprays.  Medications will be decreased or discontinued as symptom relief from immunotherapy becomes evident.

## 2019-02-28 NOTE — Progress Notes (Signed)
Follow-up Note  RE: Antonio SchirmerDavid M Petteway MRN: [id number] DOB: 12/09/1960 Date of Office Visit: 02/28/2019  Primary care provider: Gweneth DimitriMcNeill, Wendy, MD Referring provider: Gweneth DimitriMcNeill, Wendy, MD  History of present illness: Antonio Holmes is a 58 y.o. male with persistent asthma and allergic rhinitis presenting today for follow-up.  He was last seen in this clinic on August 30, 2018.  He reports that he had a respiratory tract infection in February which required antibiotics, injected corticosteroids, and prednisone.  The symptoms lasted for approximately 2 weeks.  Since that time, his upper and lower respiratory symptoms have been well controlled.  He is currently taking Symbicort 160-4.5 g, 2 inhalations via spacer device twice daily, and montelukast 10 mg daily at bedtime.  While on this regimen, he typically requires albuterol rescue on one occasion every 2 weeks on average.  His asthma symptoms are typically triggered by physical exertion at work or walking up steep inclines.  He currently does not use albuterol prior to vigorous physical activity or exercise.  He denies limitations in normal daily activities and nocturnal awakenings due to lower respiratory symptoms. He reports that his nasal/sinus/ocular symptoms are currently well controlled.  He is receiving aeroallergen immunotherapy injections at maintenance dose without problems or complications.  He uses fluticasone nasal spray if needed.  He needs a prescription refills.  Assessment and plan: Moderate persistent asthma Stable.  Continue Symbicort 160-4.5 g, 2 inhalations via spacer device twice daily, montelukast 10 mg daily at bedtime, and albuterol HFA, 1 to 2 inhalations every 4-6 hours as needed and 15 minutes prior to exercise or vigorous exertion.  Refill prescriptions have been provided for Symbicort and montelukast.  The montelukast boxed warning has been discussed and the patient has verbalized understanding.  Subjective and  objective measures of pulmonary function will be followed and the treatment plan will be adjusted accordingly.  Perennial and seasonal allergic rhinitis Stable.   Continue appropriate allergen avoidance measures, aeroallergen immunotherapy as prescribed and as tolerated, montelukast 10 mg daily, and fluticasone nasal spray as needed.  Nasal saline spray (i.e., Simply Saline) or nasal saline lavage (i.e., NeilMed) is recommended as needed and prior to medicated nasal sprays.  Medications will be decreased or discontinued as symptom relief from immunotherapy becomes evident.   Meds ordered this encounter  Medications  . albuterol (PROAIR HFA) 108 (90 Base) MCG/ACT inhaler    Sig: 2 puffs every 4 hours as needed only  if your can't catch your breath    Dispense:  18 g    Refill:  1  . budesonide-formoterol (SYMBICORT) 160-4.5 MCG/ACT inhaler    Sig: Inhale 2 puffs into the lungs 2 (two) times daily.    Dispense:  1 Inhaler    Refill:  5  . fluticasone (FLONASE) 50 MCG/ACT nasal spray    Sig: Place 2 sprays into both nostrils daily.    Dispense:  16 g    Refill:  5  . montelukast (SINGULAIR) 10 MG tablet    Sig: Take 1 tablet (10 mg total) by mouth at bedtime.    Dispense:  30 tablet    Refill:  5  . EPINEPHrine (AUVI-Q) 0.3 mg/0.3 mL IJ SOAJ injection    Sig: Inject 0.3 mLs (0.3 mg total) into the muscle once for 1 dose. As directed for life-threatening allergic reactions    Dispense:  2 each    Refill:  1    [phone] (Home Phone)    Diagnostics: Spirometry:  Normal with  an FEV1 of 105% predicted. This study was performed while the patient was asymptomatic.  Please see scanned spirometry results for details.    Physical examination: Blood pressure 102/70, pulse 80, temperature (!) 97.5 F (36.4 C), temperature source Tympanic, resp. rate 16, height 5\' 3"  (1.6 m), SpO2 97 %.  General: Alert, interactive, in no acute distress. HEENT: TMs pearly gray, turbinates minimally  edematous without discharge, post-pharynx mildly erythematous. Neck: Supple without lymphadenopathy. Lungs: Clear to auscultation without wheezing, rhonchi or rales. CV: Normal S1, S2 without murmurs. Skin: Warm and dry, without lesions or rashes.  The following portions of the patient's history were reviewed and updated as appropriate: allergies, current medications, past family history, past medical history, past social history, past surgical history and problem list.  Allergies as of 02/28/2019      Reactions   Tramadol    "made my skin crawl"      Medication List       Accurate as of February 28, 2019 10:49 AM. If you have any questions, ask your nurse or doctor.        acetaminophen 325 MG tablet Commonly known as: TYLENOL Take 650 mg by mouth every 6 (six) hours as needed.   albuterol 108 (90 Base) MCG/ACT inhaler Commonly known as: ProAir HFA 2 puffs every 4 hours as needed only  if your can't catch your breath   Azelastine HCl 0.15 % Soln Place 1-2 sprays into both nostrils 2 (two) times daily as needed.   budesonide-formoterol 160-4.5 MCG/ACT inhaler Commonly known as: Symbicort Inhale 2 puffs into the lungs 2 (two) times daily.   EPINEPHrine 0.3 mg/0.3 mL Soaj injection Commonly known as: Auvi-Q Inject 0.3 mLs (0.3 mg total) into the muscle once for 1 dose. As directed for life-threatening allergic reactions What changed: additional instructions Changed by: Edmonia Lynch, MD   fluticasone 50 MCG/ACT nasal spray Commonly known as: FLONASE Place 2 sprays into both nostrils daily.   levocetirizine 5 MG tablet Commonly known as: XYZAL Take 5 mg by mouth every evening.   montelukast 10 MG tablet Commonly known as: SINGULAIR Take 1 tablet (10 mg total) by mouth at bedtime.       Allergies  Allergen Reactions  . Tramadol     "made my skin crawl"   Review of systems: Review of systems negative except as noted in HPI / PMHx or noted below: Constitutional:  Negative.  HENT: Negative.   Eyes: Negative.  Respiratory: Negative.   Cardiovascular: Negative.  Gastrointestinal: Negative.  Genitourinary: Negative.  Musculoskeletal: Negative.  Neurological: Negative.  Endo/Heme/Allergies: Negative.  Cutaneous: Negative.  Past Medical History:  Diagnosis Date  . Asthma   . Cough     Family History  Problem Relation Age of Onset  . Hypertension Father   . Allergic rhinitis Neg Hx   . Angioedema Neg Hx   . Asthma Neg Hx   . Atopy Neg Hx   . Eczema Neg Hx   . Immunodeficiency Neg Hx   . Urticaria Neg Hx     Social History   Socioeconomic History  . Marital status: Married    Spouse name: Not on file  . Number of children: Not on file  . Years of education: Not on file  . Highest education level: Not on file  Occupational History  . Occupation: Operates Estate manager/land agent  . Financial resource strain: Not on file  . Food insecurity    Worry: Not on file  Inability: Not on file  . Transportation needs    Medical: Not on file    Non-medical: Not on file  Tobacco Use  . Smoking status: Former Smoker    Packs/day: 1.00    Years: 0.50    Pack years: 0.50    Types: Cigarettes    Quit date: 08/24/1992    Years since quitting: 26.5  . Smokeless tobacco: Never Used  Substance and Sexual Activity  . Alcohol use: No    Alcohol/week: 0.0 standard drinks  . Drug use: No  . Sexual activity: Not on file  Lifestyle  . Physical activity    Days per week: Not on file    Minutes per session: Not on file  . Stress: Not on file  Relationships  . Social Musicianconnections    Talks on phone: Not on file    Gets together: Not on file    Attends religious service: Not on file    Active member of club or organization: Not on file    Attends meetings of clubs or organizations: Not on file    Relationship status: Not on file  . Intimate partner violence    Fear of current or ex partner: Not on file    Emotionally abused: Not on file     Physically abused: Not on file    Forced sexual activity: Not on file  Other Topics Concern  . Not on file  Social History Narrative  . Not on file    I appreciate the opportunity to take part in Auther's care. Please do not hesitate to contact me with questions.  Sincerely,   R. Jorene Guestarter Tejal Monroy, MD

## 2019-03-08 NOTE — Progress Notes (Signed)
Vials exp 03-07-2020 

## 2019-03-09 DIAGNOSIS — J301 Allergic rhinitis due to pollen: Secondary | ICD-10-CM | POA: Diagnosis not present

## 2019-03-10 ENCOUNTER — Other Ambulatory Visit: Payer: Self-pay

## 2019-03-10 ENCOUNTER — Ambulatory Visit (INDEPENDENT_AMBULATORY_CARE_PROVIDER_SITE_OTHER): Payer: BC Managed Care – PPO

## 2019-03-10 DIAGNOSIS — J309 Allergic rhinitis, unspecified: Secondary | ICD-10-CM

## 2019-03-29 ENCOUNTER — Ambulatory Visit (INDEPENDENT_AMBULATORY_CARE_PROVIDER_SITE_OTHER): Payer: BC Managed Care – PPO

## 2019-03-29 ENCOUNTER — Other Ambulatory Visit: Payer: Self-pay

## 2019-03-29 DIAGNOSIS — J309 Allergic rhinitis, unspecified: Secondary | ICD-10-CM | POA: Diagnosis not present

## 2019-04-05 ENCOUNTER — Ambulatory Visit (INDEPENDENT_AMBULATORY_CARE_PROVIDER_SITE_OTHER): Payer: BC Managed Care – PPO

## 2019-04-05 ENCOUNTER — Other Ambulatory Visit: Payer: Self-pay

## 2019-04-05 DIAGNOSIS — J309 Allergic rhinitis, unspecified: Secondary | ICD-10-CM

## 2019-04-12 ENCOUNTER — Ambulatory Visit (INDEPENDENT_AMBULATORY_CARE_PROVIDER_SITE_OTHER): Payer: BC Managed Care – PPO

## 2019-04-12 DIAGNOSIS — J309 Allergic rhinitis, unspecified: Secondary | ICD-10-CM

## 2019-04-19 ENCOUNTER — Ambulatory Visit (INDEPENDENT_AMBULATORY_CARE_PROVIDER_SITE_OTHER): Payer: BC Managed Care – PPO | Admitting: *Deleted

## 2019-04-19 DIAGNOSIS — J309 Allergic rhinitis, unspecified: Secondary | ICD-10-CM

## 2019-04-26 ENCOUNTER — Ambulatory Visit (INDEPENDENT_AMBULATORY_CARE_PROVIDER_SITE_OTHER): Payer: BC Managed Care – PPO | Admitting: *Deleted

## 2019-04-26 DIAGNOSIS — J309 Allergic rhinitis, unspecified: Secondary | ICD-10-CM | POA: Diagnosis not present

## 2019-05-17 ENCOUNTER — Ambulatory Visit (INDEPENDENT_AMBULATORY_CARE_PROVIDER_SITE_OTHER): Payer: BC Managed Care – PPO

## 2019-05-17 DIAGNOSIS — J309 Allergic rhinitis, unspecified: Secondary | ICD-10-CM | POA: Diagnosis not present

## 2019-06-07 ENCOUNTER — Ambulatory Visit (INDEPENDENT_AMBULATORY_CARE_PROVIDER_SITE_OTHER): Payer: BC Managed Care – PPO

## 2019-06-07 DIAGNOSIS — J309 Allergic rhinitis, unspecified: Secondary | ICD-10-CM | POA: Diagnosis not present

## 2019-06-28 ENCOUNTER — Ambulatory Visit (INDEPENDENT_AMBULATORY_CARE_PROVIDER_SITE_OTHER): Payer: BC Managed Care – PPO

## 2019-06-28 DIAGNOSIS — J309 Allergic rhinitis, unspecified: Secondary | ICD-10-CM

## 2019-07-04 ENCOUNTER — Other Ambulatory Visit: Payer: Self-pay

## 2019-07-04 ENCOUNTER — Encounter: Payer: Self-pay | Admitting: Allergy and Immunology

## 2019-07-04 ENCOUNTER — Ambulatory Visit: Payer: BC Managed Care – PPO | Admitting: Allergy and Immunology

## 2019-07-04 VITALS — BP 118/80 | HR 81 | Temp 97.8°F | Resp 16 | Ht 64.25 in | Wt 186.8 lb

## 2019-07-04 DIAGNOSIS — J4540 Moderate persistent asthma, uncomplicated: Secondary | ICD-10-CM | POA: Diagnosis not present

## 2019-07-04 DIAGNOSIS — R05 Cough: Secondary | ICD-10-CM

## 2019-07-04 DIAGNOSIS — R053 Chronic cough: Secondary | ICD-10-CM

## 2019-07-04 DIAGNOSIS — J3089 Other allergic rhinitis: Secondary | ICD-10-CM

## 2019-07-04 MED ORDER — ALBUTEROL SULFATE HFA 108 (90 BASE) MCG/ACT IN AERS: INHALATION_SPRAY | RESPIRATORY_TRACT | 1 refills | Status: DC

## 2019-07-04 MED ORDER — MONTELUKAST SODIUM 10 MG PO TABS: 10.0000 mg | ORAL_TABLET | Freq: Every day | ORAL | 5 refills | Status: DC

## 2019-07-04 MED ORDER — SPIRIVA RESPIMAT 1.25 MCG/ACT IN AERS: 2.0000 | INHALATION_SPRAY | Freq: Every day | RESPIRATORY_TRACT | 5 refills | Status: DC

## 2019-07-04 MED ORDER — FLUTICASONE PROPIONATE 50 MCG/ACT NA SUSP: 2.0000 | Freq: Every day | NASAL | 5 refills | Status: AC

## 2019-07-04 MED ORDER — BUDESONIDE-FORMOTEROL FUMARATE 160-4.5 MCG/ACT IN AERO: 2.0000 | INHALATION_SPRAY | Freq: Two times a day (BID) | RESPIRATORY_TRACT | 5 refills | Status: DC

## 2019-07-04 NOTE — Assessment & Plan Note (Signed)
Stable.   Continue appropriate allergen avoidance measures, aeroallergen immunotherapy as prescribed and as tolerated, montelukast 10 mg daily, and fluticasone nasal spray as needed.  Nasal saline spray (i.e., Simply Saline) or nasal saline lavage (i.e., NeilMed) is recommended as needed and prior to medicated nasal sprays.  Medications will be decreased or discontinued as symptom relief from immunotherapy becomes evident.

## 2019-07-04 NOTE — Assessment & Plan Note (Addendum)
History suggests bronchial hyperresponsiveness secondary to cold weather exposure.  Treatment and plan as outlined above for moderate persistent asthma.

## 2019-07-04 NOTE — Assessment & Plan Note (Signed)
Currently with suboptimal control, we will step up therapy at this time.  Continue Symbicort 160-4.5 g, 2 inhalations via spacer device twice daily, montelukast 10 mg daily at bedtime, and albuterol HFA, 1 to 2 inhalations every 4-6 hours as needed and 15 minutes prior to exercise or vigorous exertion.  A prescription has been provided for Spiriva Respimat 1.25 g, 2 inhalations daily.  Subjective and objective measures of pulmonary function will be followed and the treatment plan will be adjusted accordingly.

## 2019-07-04 NOTE — Patient Instructions (Addendum)
Moderate persistent asthma Currently with suboptimal control, we will step up therapy at this time.  Continue Symbicort 160-4.5 g, 2 inhalations via spacer device twice daily, montelukast 10 mg daily at bedtime, and albuterol HFA, 1 to 2 inhalations every 4-6 hours as needed and 15 minutes prior to exercise or vigorous exertion.  A prescription has been provided for Spiriva Respimat 1.25 g, 2 inhalations daily.  Subjective and objective measures of pulmonary function will be followed and the treatment plan will be adjusted accordingly.  Cough, persistent History suggests bronchial hyperresponsiveness secondary to cold weather exposure.  Treatment and plan as outlined above for moderate persistent asthma.  Perennial and seasonal allergic rhinitis Stable.   Continue appropriate allergen avoidance measures, aeroallergen immunotherapy as prescribed and as tolerated, montelukast 10 mg daily, and fluticasone nasal spray as needed.  Nasal saline spray (i.e., Simply Saline) or nasal saline lavage (i.e., NeilMed) is recommended as needed and prior to medicated nasal sprays.  Medications will be decreased or discontinued as symptom relief from immunotherapy becomes evident.   Return in about 4 or 5 months, or if symptoms worsen or fail to improve.

## 2019-07-04 NOTE — Progress Notes (Signed)
Follow-up Note  RE: Antonio Holmes MRN: [id number] DOB: May 24, 1961 Date of Office Visit: 07/04/2019  Primary care provider: Gweneth Dimitri, MD Referring provider: Gweneth Dimitri, MD  History of present illness: Antonio Holmes is a 58 y.o. male with persistent asthma and allergic rhinitis presenting today for follow-up.  He was last seen in this clinic in July 2020.  He reports that throughout the spring and summer his asthma has been well controlled, however since early October he has developed a persistent cough.  He reports that over the past 10 to 12 years starting in early October and ending in early spring he has a cough which is "a constant thing, nothing to stop it."  The cough is described as dry/nonproductive and he uncertain if it originates in the throat or chest.  However, he notes that it seems to be exacerbated when he works outdoors in the cold air.  He takes a "homemade" cough syrup in the morning which seems to help.  He reports that prescription cough syrup with codeine provides no benefit.  He is currently taking Symbicort 160-4.5 micro grams, 2 inhalations via spacer device twice daily, and montelukast 10 mg daily at bedtime.  He reports that his nasal and sinus symptoms are well controlled with fluticasone nasal spray as needed.  Assessment and plan: Moderate persistent asthma Currently with suboptimal control, we will step up therapy at this time.  Continue Symbicort 160-4.5 g, 2 inhalations via spacer device twice daily, montelukast 10 mg daily at bedtime, and albuterol HFA, 1 to 2 inhalations every 4-6 hours as needed and 15 minutes prior to exercise or vigorous exertion.  A prescription has been provided for Spiriva Respimat 1.25 g, 2 inhalations daily.  Subjective and objective measures of pulmonary function will be followed and the treatment plan will be adjusted accordingly.  Cough, persistent History suggests bronchial hyperresponsiveness secondary to cold  weather exposure.  Treatment and plan as outlined above for moderate persistent asthma.  Perennial and seasonal allergic rhinitis Stable.   Continue appropriate allergen avoidance measures, aeroallergen immunotherapy as prescribed and as tolerated, montelukast 10 mg daily, and fluticasone nasal spray as needed.  Nasal saline spray (i.e., Simply Saline) or nasal saline lavage (i.e., NeilMed) is recommended as needed and prior to medicated nasal sprays.  Medications will be decreased or discontinued as symptom relief from immunotherapy becomes evident.   Meds ordered this encounter  Medications  . albuterol (PROAIR HFA) 108 (90 Base) MCG/ACT inhaler    Sig: 2 puffs every 4 hours as needed only  if your can't catch your breath    Dispense:  18 g    Refill:  1  . budesonide-formoterol (SYMBICORT) 160-4.5 MCG/ACT inhaler    Sig: Inhale 2 puffs into the lungs 2 (two) times daily.    Dispense:  1 Inhaler    Refill:  5  . fluticasone (FLONASE) 50 MCG/ACT nasal spray    Sig: Place 2 sprays into both nostrils daily.    Dispense:  16 g    Refill:  5  . montelukast (SINGULAIR) 10 MG tablet    Sig: Take 1 tablet (10 mg total) by mouth at bedtime.    Dispense:  30 tablet    Refill:  5  . Tiotropium Bromide Monohydrate (SPIRIVA RESPIMAT) 1.25 MCG/ACT AERS    Sig: Inhale 2 puffs into the lungs daily.    Dispense:  4 g    Refill:  5    Diagnostics: Spirometry:  Normal with  an FEV1 of 95% predicted. This study was performed while the patient was asymptomatic.  Please see scanned spirometry results for details.    Physical examination: Blood pressure 118/80, pulse 81, temperature 97.8 F (36.6 C), temperature source Temporal, resp. rate 16, height 5' 4.25" (1.632 m), weight 186 lb 12.8 oz (84.7 kg), SpO2 97 %.  General: Alert, interactive, in no acute distress. HEENT: TMs pearly gray, turbinates mildly edematous with clear discharge, post-pharynx mildly erythematous. Neck: Supple without  lymphadenopathy. Lungs: Clear to auscultation without wheezing, rhonchi or rales. CV: Normal S1, S2 without murmurs. Skin: Warm and dry, without lesions or rashes.  The following portions of the patient's history were reviewed and updated as appropriate: allergies, current medications, past family history, past medical history, past social history, past surgical history and problem list.  Current Outpatient Medications  Medication Sig Dispense Refill  . acetaminophen (TYLENOL) 325 MG tablet Take 650 mg by mouth every 6 (six) hours as needed.    Marland Kitchen. albuterol (PROAIR HFA) 108 (90 Base) MCG/ACT inhaler 2 puffs every 4 hours as needed only  if your can't catch your breath 18 g 1  . Azelastine HCl 0.15 % SOLN Place 1-2 sprays into both nostrils 2 (two) times daily as needed. 30 mL 5  . budesonide-formoterol (SYMBICORT) 160-4.5 MCG/ACT inhaler Inhale 2 puffs into the lungs 2 (two) times daily. 1 Inhaler 5  . fluticasone (FLONASE) 50 MCG/ACT nasal spray Place 2 sprays into both nostrils daily. 16 g 5  . ketoconazole (NIZORAL) 2 % shampoo APPLY TO AFFECTED AREA; LEAVE FOR 3 TO 5 MINUTES BEFORE RINSING    . levocetirizine (XYZAL) 5 MG tablet Take 5 mg by mouth every evening.    . montelukast (SINGULAIR) 10 MG tablet Take 1 tablet (10 mg total) by mouth at bedtime. 30 tablet 5  . SF 5000 PLUS 1.1 % CREA dental cream Use to brush teeth beforeYgoing to bed, expectorate, then floss. Do not eat, drink, or rinse for 30 minutes.    . Tiotropium Bromide Monohydrate (SPIRIVA RESPIMAT) 1.25 MCG/ACT AERS Inhale 2 puffs into the lungs daily. 4 g 5   No current facility-administered medications for this visit.     Allergies  Allergen Reactions  . Tramadol     "made my skin crawl"   Review of systems: Review of systems negative except as noted in HPI / PMHx or noted below: Constitutional: Negative.  HENT: Negative.   Eyes: Negative.  Respiratory: Negative.   Cardiovascular: Negative.  Gastrointestinal:  Negative.  Genitourinary: Negative.  Musculoskeletal: Negative.  Neurological: Negative.  Endo/Heme/Allergies: Negative.  Cutaneous: Negative.  Past Medical History:  Diagnosis Date  . Asthma   . Cough     Family History  Problem Relation Age of Onset  . Hypertension Father   . Allergic rhinitis Neg Hx   . Angioedema Neg Hx   . Asthma Neg Hx   . Atopy Neg Hx   . Eczema Neg Hx   . Immunodeficiency Neg Hx   . Urticaria Neg Hx     Social History   Socioeconomic History  . Marital status: Married    Spouse name: Not on file  . Number of children: Not on file  . Years of education: Not on file  . Highest education level: Not on file  Occupational History  . 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 Financial resource strain: Not on file  . Food insecurity    Worry: Not on file  Inability: Not on file  . Transportation needs    Medical: Not on file    Non-medical: Not on file  Tobacco Use  . Smoking status: Former Smoker    Packs/day: 1.00    Years: 0.50    Pack years: 0.50    Types: Cigarettes    Quit date: 08/24/1992    Years since quitting: 26.8  . Smokeless tobacco: Never Used  Substance and Sexual Activity  . Alcohol use: No    Alcohol/week: 0.0 standard drinks  . Drug use: No  . Sexual activity: Not on file  Lifestyle  . Physical activity    Days per week: Not on file    Minutes per session: Not on file  . Stress: Not on file  Relationships  . Social Herbalist on phone: Not on file    Gets together: Not on file    Attends religious service: Not on file    Active member of club or organization: Not on file    Attends meetings of clubs or organizations: Not on file    Relationship status: Not on file  . Intimate partner violence    Fear of current or ex partner: Not on file    Emotionally abused: Not on file    Physically abused: Not on file    Forced sexual activity: Not on file  Other Topics Concern  . Not on file  Social  History Narrative  . Not on file    I appreciate the opportunity to take part in Ivaan's care. Please do not hesitate to contact me with questions.  Sincerely,   R. Edgar Frisk, MD

## 2019-07-10 DIAGNOSIS — J301 Allergic rhinitis due to pollen: Secondary | ICD-10-CM | POA: Diagnosis not present

## 2019-07-10 NOTE — Progress Notes (Signed)
VIALS EXP 07-09-20 

## 2019-07-19 ENCOUNTER — Ambulatory Visit (INDEPENDENT_AMBULATORY_CARE_PROVIDER_SITE_OTHER): Payer: BC Managed Care – PPO

## 2019-07-19 DIAGNOSIS — J309 Allergic rhinitis, unspecified: Secondary | ICD-10-CM

## 2019-08-09 ENCOUNTER — Ambulatory Visit (INDEPENDENT_AMBULATORY_CARE_PROVIDER_SITE_OTHER): Payer: BC Managed Care – PPO | Admitting: *Deleted

## 2019-08-09 DIAGNOSIS — J309 Allergic rhinitis, unspecified: Secondary | ICD-10-CM | POA: Diagnosis not present

## 2019-08-30 ENCOUNTER — Ambulatory Visit (INDEPENDENT_AMBULATORY_CARE_PROVIDER_SITE_OTHER): Payer: BC Managed Care – PPO

## 2019-08-30 DIAGNOSIS — J309 Allergic rhinitis, unspecified: Secondary | ICD-10-CM

## 2019-09-20 ENCOUNTER — Ambulatory Visit (INDEPENDENT_AMBULATORY_CARE_PROVIDER_SITE_OTHER): Payer: BC Managed Care – PPO

## 2019-09-20 DIAGNOSIS — J309 Allergic rhinitis, unspecified: Secondary | ICD-10-CM

## 2019-09-27 ENCOUNTER — Ambulatory Visit (INDEPENDENT_AMBULATORY_CARE_PROVIDER_SITE_OTHER): Payer: BC Managed Care – PPO

## 2019-09-27 DIAGNOSIS — J309 Allergic rhinitis, unspecified: Secondary | ICD-10-CM | POA: Diagnosis not present

## 2019-10-04 ENCOUNTER — Ambulatory Visit (INDEPENDENT_AMBULATORY_CARE_PROVIDER_SITE_OTHER): Payer: BC Managed Care – PPO

## 2019-10-04 DIAGNOSIS — J309 Allergic rhinitis, unspecified: Secondary | ICD-10-CM

## 2019-10-11 ENCOUNTER — Ambulatory Visit (INDEPENDENT_AMBULATORY_CARE_PROVIDER_SITE_OTHER): Payer: BC Managed Care – PPO

## 2019-10-11 DIAGNOSIS — J309 Allergic rhinitis, unspecified: Secondary | ICD-10-CM | POA: Diagnosis not present

## 2019-10-18 ENCOUNTER — Ambulatory Visit (INDEPENDENT_AMBULATORY_CARE_PROVIDER_SITE_OTHER): Payer: BC Managed Care – PPO

## 2019-10-18 DIAGNOSIS — J309 Allergic rhinitis, unspecified: Secondary | ICD-10-CM

## 2019-11-15 ENCOUNTER — Ambulatory Visit (INDEPENDENT_AMBULATORY_CARE_PROVIDER_SITE_OTHER): Payer: BC Managed Care – PPO

## 2019-11-15 DIAGNOSIS — J309 Allergic rhinitis, unspecified: Secondary | ICD-10-CM | POA: Diagnosis not present

## 2019-12-05 ENCOUNTER — Ambulatory Visit: Payer: BC Managed Care – PPO | Admitting: Allergy and Immunology

## 2019-12-05 ENCOUNTER — Other Ambulatory Visit: Payer: Self-pay

## 2019-12-05 ENCOUNTER — Encounter: Payer: Self-pay | Admitting: Allergy and Immunology

## 2019-12-05 VITALS — BP 110/60 | HR 77 | Temp 97.2°F | Resp 16 | Wt 188.0 lb

## 2019-12-05 DIAGNOSIS — J3089 Other allergic rhinitis: Secondary | ICD-10-CM | POA: Diagnosis not present

## 2019-12-05 DIAGNOSIS — M25562 Pain in left knee: Secondary | ICD-10-CM | POA: Insufficient documentation

## 2019-12-05 DIAGNOSIS — J4540 Moderate persistent asthma, uncomplicated: Secondary | ICD-10-CM

## 2019-12-05 MED ORDER — ALBUTEROL SULFATE HFA 108 (90 BASE) MCG/ACT IN AERS: INHALATION_SPRAY | RESPIRATORY_TRACT | 1 refills | Status: DC

## 2019-12-05 MED ORDER — MONTELUKAST SODIUM 10 MG PO TABS: 10.0000 mg | ORAL_TABLET | Freq: Every day | ORAL | 5 refills | Status: DC

## 2019-12-05 MED ORDER — BUDESONIDE-FORMOTEROL FUMARATE 160-4.5 MCG/ACT IN AERO: 2.0000 | INHALATION_SPRAY | Freq: Two times a day (BID) | RESPIRATORY_TRACT | 5 refills | Status: DC

## 2019-12-05 NOTE — Progress Notes (Signed)
Follow-up Note  RE: Antonio Holmes MRN: [id number] DOB: 27-May-1961 Date of Office Visit: 12/05/2019  Primary care provider: Cari Caraway, MD Referring provider: Cari Caraway, MD  History of present illness: Antonio Holmes is a 59 y.o. male with persistent asthma and allergic rhinitis presenting today for follow-up.  He was last seen in this clinic in July 2020.  He reports that his asthma has been well controlled while taking Symbicort 160 g, 2 inhalations via spacer device twice daily, and Spiriva 2 inhalations daily.  He was started on the Spiriva during last visit due to a mild exacerbation, however he does not feel that he requires the Spiriva anymore.  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  He denies limitations in normal daily activities or nocturnal awakenings due to lower respiratory symptoms. He is receiving immunotherapy injections without problems or complications.  His nasal symptoms have been well controlled with that occasional use of fluticasone nasal spray and/or nasal saline. He has been experiencing pain in his left knee.  The pain is localized to the medial aspect near the joint space.  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  Assessment and plan: Moderate persistent asthma  Continue Symbicort 160-4.5 g, 2 inhalations via spacer device twice daily, montelukast 10 mg daily at bedtime, and albuterol HFA, 1 to 2 inhalations every 4-6 hours as needed and 15 minutes prior to exercise or vigorous exertion.  May hold Spiriva Respimat for now.  If lower respiratory symptoms progress in frequency and/or severity, the patient is to resume Spiriva Respimat, 2 inhalations daily.  Subjective and objective measures of pulmonary function will be followed and the  treatment plan will be adjusted accordingly.  Perennial and seasonal allergic rhinitis Stable.   Continue appropriate allergen avoidance measures, aeroallergen immunotherapy as prescribed and as tolerated, montelukast 10 mg daily, and fluticasone nasal spray as needed.  Nasal saline spray (i.e., Simply Saline) or nasal saline lavage (i.e., NeilMed) is recommended as needed and prior to medicated nasal sprays.  Left knee pain  I have recommended that [name] seek evaluation with Dr. Lara Mulch.   Meds ordered this encounter  Medications  . albuterol (PROAIR HFA) 108 (90 Base) MCG/ACT inhaler    Sig: 2 puffs every 4 hours as needed only  if your can't catch your breath    Dispense:  18 g    Refill:  1  . budesonide-formoterol (SYMBICORT) 160-4.5 MCG/ACT inhaler    Sig: Inhale 2 puffs into the lungs 2 (two) times daily.    Dispense:  1 Inhaler    Refill:  5  . montelukast (SINGULAIR) 10 MG tablet    Sig: Take 1 tablet (10 mg total) by mouth at bedtime.    Dispense:  30 tablet    Refill:  5    Diagnostics: Spirometry:  Normal with an FEV1 of 99% predicted. This study was performed while the patient was asymptomatic.  Please see scanned spirometry results for details.    Physical examination: Blood pressure 110/60, pulse 77, temperature (!) 97.2 F (36.2 C), temperature source Temporal, resp. rate 16, weight 188 lb (85.3 kg), SpO2 98 %.  General: Alert, interactive, in no acute distress. HEENT: TMs pearly gray, turbinates mildly edematous without discharge, post-pharynx mildly erythematous. Neck: Supple without lymphadenopathy. Lungs: Clear to auscultation  without wheezing, rhonchi or rales. CV: Normal S1, S2 without murmurs. Skin: Warm and dry, without lesions or rashes.  The following portions of the patient's history were reviewed and updated as appropriate: allergies, current medications, past family history, past medical history, past social history, past surgical  history and problem list.  Current Outpatient Medications  Medication Sig Dispense Refill  . albuterol (PROAIR HFA) 108 (90 Base) MCG/ACT inhaler 2 puffs every 4 hours as needed only  if your can't catch your breath 18 g 1  . Azelastine HCl 0.15 % SOLN Place 1-2 sprays into both nostrils 2 (two) times daily as needed. 30 mL 5  . budesonide-formoterol (SYMBICORT) 160-4.5 MCG/ACT inhaler Inhale 2 puffs into the lungs 2 (two) times daily. 1 Inhaler 5  . fluticasone (FLONASE) 50 MCG/ACT nasal spray Place 2 sprays into both nostrils daily. 16 g 5  . levocetirizine (XYZAL) 5 MG tablet Take 5 mg by mouth every evening.    . montelukast (SINGULAIR) 10 MG tablet Take 1 tablet (10 mg total) by mouth at bedtime. 30 tablet 5  . Tiotropium Bromide Monohydrate (SPIRIVA RESPIMAT) 1.25 MCG/ACT AERS Inhale 2 puffs into the lungs daily. 4 g 5  . acetaminophen (TYLENOL) 325 MG tablet Take 650 mg by mouth every 6 (six) hours as needed.    Marland Kitchen ketoconazole (NIZORAL) 2 % shampoo APPLY TO AFFECTED AREA; LEAVE FOR 3 TO 5 MINUTES BEFORE RINSING    . SF 5000 PLUS 1.1 % CREA dental cream Use to brush teeth beforeYgoing to bed, expectorate, then floss. Do not eat, drink, or rinse for 30 minutes.     No current facility-administered medications for this visit.    Allergies  Allergen Reactions  . Tramadol     "made my skin crawl"   Review of systems: Review of systems negative except as noted in HPI / PMHx.  Past Medical History:  Diagnosis Date  . Asthma   . Cough     Family History  Problem Relation Age of Onset  . Hypertension Father   . Allergic rhinitis Neg Hx   . Angioedema Neg Hx   . Asthma Neg Hx   . Atopy Neg Hx   . Eczema Neg Hx   . Immunodeficiency Neg Hx   . Urticaria Neg Hx     Social History   Socioeconomic History  . Marital status: Married    Spouse name: Not on file  . Number of children: Not on file  . Years of education: Not on file  . Highest education level: Not on file    Occupational History  . Occupation: Operates Engineer, structural   Tobacco Use  . Smoking status: Former Smoker    Packs/day: 1.00    Years: 0.50    Pack years: 0.50    Types: Cigarettes    Quit date: 08/24/1992    Years since quitting: 27.2  . Smokeless tobacco: Never Used  Substance and Sexual Activity  . Alcohol use: No    Alcohol/week: 0.0 standard drinks  . Drug use: No  . Sexual activity: Not on file  Other Topics Concern  . Not on file  Social History Narrative  . Not on file   Social Determinants of Health   Financial Resource Strain:   . Difficulty of Paying Living Expenses:   Food Insecurity:   . Worried About Programme researcher, broadcasting/film/video in the Last Year:   . Barista in the Last Year:   Transportation Needs:   .  Lack of Transportation (Medical):   Marland Kitchen Lack of Transportation (Non-Medical):   Physical Activity:   . Days of Exercise per Week:   . Minutes of Exercise per Session:   Stress:   . Feeling of Stress :   Social Connections:   . Frequency of Communication with Friends and Family:   . Frequency of Social Gatherings with Friends and Family:   . Attends Religious Services:   . Active Member of Clubs or Organizations:   . Attends Banker Meetings:   Marland Kitchen Marital Status:   Intimate Partner Violence:   . Fear of Current or Ex-Partner:   . Emotionally Abused:   Marland Kitchen Physically Abused:   . Sexually Abused:     I appreciate the opportunity to take part in Tiberius's care. Please do not hesitate to contact me with questions.  Sincerely,   R. Jorene Guest, MD

## 2019-12-05 NOTE — Assessment & Plan Note (Signed)
Stable.   Continue appropriate allergen avoidance measures, aeroallergen immunotherapy as prescribed and as tolerated, montelukast 10 mg daily, and fluticasone nasal spray as needed.  Nasal saline spray (i.e., Simply Saline) or nasal saline lavage (i.e., NeilMed) is recommended as needed and prior to medicated nasal sprays.

## 2019-12-05 NOTE — Assessment & Plan Note (Signed)
   Continue Symbicort 160-4.5 g, 2 inhalations via spacer device twice daily, montelukast 10 mg daily at bedtime, and albuterol HFA, 1 to 2 inhalations every 4-6 hours as needed and 15 minutes prior to exercise or vigorous exertion.  May hold Spiriva Respimat for now.  If lower respiratory symptoms progress in frequency and/or severity, the patient is to resume Spiriva Respimat, 2 inhalations daily.  Subjective and objective measures of pulmonary function will be followed and the treatment plan will be adjusted accordingly.

## 2019-12-05 NOTE — Patient Instructions (Addendum)
Moderate persistent asthma  Continue Symbicort 160-4.5 g, 2 inhalations via spacer device twice daily, montelukast 10 mg daily at bedtime, and albuterol HFA, 1 to 2 inhalations every 4-6 hours as needed and 15 minutes prior to exercise or vigorous exertion.  May hold Spiriva Respimat for now.  If lower respiratory symptoms progress in frequency and/or severity, the patient is to resume Spiriva Respimat, 2 inhalations daily.  Subjective and objective measures of pulmonary function will be followed and the treatment plan will be adjusted accordingly.  Perennial and seasonal allergic rhinitis Stable.   Continue appropriate allergen avoidance measures, aeroallergen immunotherapy as prescribed and as tolerated, montelukast 10 mg daily, and fluticasone nasal spray as needed.  Nasal saline spray (i.e., Simply Saline) or nasal saline lavage (i.e., NeilMed) is recommended as needed and prior to medicated nasal sprays.  Left knee pain  I have recommended that Antonio Hua seek evaluation with Dr. Georgena Holmes.   Return in about 4 months (around 04/05/2020), or if symptoms worsen or fail to improve.

## 2019-12-05 NOTE — Assessment & Plan Note (Signed)
   I have recommended that Onalee Hua seek evaluation with Dr. Georgena Spurling.

## 2019-12-13 ENCOUNTER — Ambulatory Visit (INDEPENDENT_AMBULATORY_CARE_PROVIDER_SITE_OTHER): Payer: BC Managed Care – PPO

## 2019-12-13 DIAGNOSIS — J309 Allergic rhinitis, unspecified: Secondary | ICD-10-CM

## 2019-12-20 ENCOUNTER — Other Ambulatory Visit: Payer: Self-pay

## 2019-12-20 ENCOUNTER — Encounter (HOSPITAL_COMMUNITY): Payer: Self-pay | Admitting: *Deleted

## 2019-12-20 ENCOUNTER — Emergency Department (HOSPITAL_COMMUNITY)
Admission: EM | Admit: 2019-12-20 | Discharge: 2019-12-20 | Disposition: A | Discharge status: Home / Self Care | Payer: BC Managed Care – PPO | Attending: Emergency Medicine | Admitting: Emergency Medicine

## 2019-12-20 ENCOUNTER — Emergency Department (HOSPITAL_COMMUNITY): Payer: BC Managed Care – PPO

## 2019-12-20 DIAGNOSIS — R10813 Right lower quadrant abdominal tenderness: Secondary | ICD-10-CM | POA: Insufficient documentation

## 2019-12-20 DIAGNOSIS — R10811 Right upper quadrant abdominal tenderness: Secondary | ICD-10-CM | POA: Diagnosis not present

## 2019-12-20 DIAGNOSIS — N1330 Unspecified hydronephrosis: Secondary | ICD-10-CM | POA: Insufficient documentation

## 2019-12-20 DIAGNOSIS — Z87891 Personal history of nicotine dependence: Secondary | ICD-10-CM | POA: Diagnosis not present

## 2019-12-20 DIAGNOSIS — Z8709 Personal history of other diseases of the respiratory system: Secondary | ICD-10-CM | POA: Insufficient documentation

## 2019-12-20 DIAGNOSIS — R1032 Left lower quadrant pain: Secondary | ICD-10-CM | POA: Diagnosis present

## 2019-12-20 DIAGNOSIS — N201 Calculus of ureter: Secondary | ICD-10-CM | POA: Insufficient documentation

## 2019-12-20 DIAGNOSIS — N134 Hydroureter: Secondary | ICD-10-CM | POA: Diagnosis not present

## 2019-12-20 LAB — COMPREHENSIVE METABOLIC PANEL
ALT: 24 U/L (ref 0–44)
AST: 28 U/L (ref 15–41)
Albumin: 3.6 g/dL (ref 3.5–5.0)
Alkaline Phosphatase: 71 U/L (ref 38–126)
Anion gap: 12 (ref 5–15)
BUN: 10 mg/dL (ref 6–20)
CO2: 22 mmol/L (ref 22–32)
Calcium: 9.1 mg/dL (ref 8.9–10.3)
Chloride: 103 mmol/L (ref 98–111)
Creatinine, Ser: 0.82 mg/dL (ref 0.61–1.24)
GFR calc Af Amer: >60 mL/min (ref >60)
GFR calc non Af Amer: >60 mL/min (ref >60)
Glucose, Bld: 132 mg/dL — ABNORMAL HIGH (ref 70–99)
Potassium: 3.7 mmol/L (ref 3.5–5.1)
Sodium: 137 mmol/L (ref 135–145)
Total Bilirubin: 1.3 mg/dL — ABNORMAL HIGH (ref 0.3–1.2)
Total Protein: 7.1 g/dL (ref 6.5–8.1)

## 2019-12-20 LAB — CBC
HCT: 48.4 % (ref 39.0–52.0)
Hemoglobin: 16.2 g/dL (ref 13.0–17.0)
MCH: 32.7 pg (ref 26.0–34.0)
MCHC: 33.5 g/dL (ref 30.0–36.0)
MCV: 97.6 fL (ref 80.0–100.0)
Platelets: 256 10*3/uL (ref 150–400)
RBC: 4.96 MIL/uL (ref 4.22–5.81)
RDW: 13.2 % (ref 11.5–15.5)
WBC: 10.6 10*3/uL — ABNORMAL HIGH (ref 4.0–10.5)
nRBC: 0 % (ref 0.0–0.2)

## 2019-12-20 LAB — LIPASE, BLOOD: Lipase: 41 U/L (ref 11–51)

## 2019-12-20 LAB — URINALYSIS, ROUTINE W REFLEX MICROSCOPIC
Bilirubin Urine: NEGATIVE
Glucose, UA: NEGATIVE mg/dL
Ketones, ur: 20 mg/dL — AB
Leukocytes,Ua: NEGATIVE
Nitrite: NEGATIVE
Protein, ur: NEGATIVE mg/dL
RBC / HPF: >50 RBC/hpf — ABNORMAL HIGH (ref 0–5)
Specific Gravity, Urine: 1.011 (ref 1.005–1.030)
pH: 7 (ref 5.0–8.0)

## 2019-12-20 MED ORDER — ONDANSETRON HCL 4 MG/2ML IJ SOLN
4.0000 mg | Freq: Once | INTRAMUSCULAR | Status: AC
  Administered 2019-12-20: 4 mg via INTRAVENOUS
  Filled 2019-12-20: qty 2

## 2019-12-20 MED ORDER — MORPHINE SULFATE (PF) 4 MG/ML IV SOLN
4.0000 mg | Freq: Once | INTRAVENOUS | Status: AC
  Administered 2019-12-20: 4 mg via INTRAVENOUS
  Filled 2019-12-20: qty 1

## 2019-12-20 MED ORDER — OXYCODONE-ACETAMINOPHEN 5-325 MG PO TABS: 1.0000 | ORAL_TABLET | ORAL | 0 refills | Status: DC | PRN

## 2019-12-20 MED ORDER — IOHEXOL 300 MG/ML  SOLN
100.0000 mL | Freq: Once | INTRAMUSCULAR | Status: AC | PRN
  Administered 2019-12-20: 100 mL via INTRAVENOUS

## 2019-12-20 MED ORDER — FENTANYL CITRATE (PF) 100 MCG/2ML IJ SOLN
50.0000 ug | INTRAMUSCULAR | Status: DC | PRN
  Administered 2019-12-20: 50 ug via INTRAVENOUS
  Filled 2019-12-20: qty 2

## 2019-12-20 MED ORDER — ONDANSETRON 4 MG PO TBDP
4.0000 mg | ORAL_TABLET | Freq: Three times a day (TID) | ORAL | 0 refills | Status: DC | PRN
Start: 2019-12-20 — End: 2020-08-13

## 2019-12-20 MED ORDER — OXYCODONE-ACETAMINOPHEN 5-325 MG PO TABS
1.0000 | ORAL_TABLET | Freq: Once | ORAL | Status: AC
  Administered 2019-12-20: 21:00:00 1 via ORAL
  Filled 2019-12-20: qty 1

## 2019-12-20 MED ORDER — MORPHINE SULFATE (PF) 4 MG/ML IV SOLN
4.0000 mg | Freq: Once | INTRAVENOUS | Status: AC
  Administered 2019-12-20: 16:00:00 4 mg via INTRAVENOUS
  Filled 2019-12-20: qty 1

## 2019-12-20 MED ORDER — TAMSULOSIN HCL 0.4 MG PO CAPS
0.4000 mg | ORAL_CAPSULE | Freq: Every day | ORAL | 0 refills | Status: AC
Start: 2019-12-20 — End: ?

## 2019-12-20 MED ORDER — SODIUM CHLORIDE 0.9% FLUSH
3.0000 mL | Freq: Once | INTRAVENOUS | Status: AC
  Administered 2019-12-20: 3 mL via INTRAVENOUS

## 2019-12-20 NOTE — ED Notes (Signed)
Rounded on and assumed care of pt. Pt resting comfortably in bed at this time. Wife at bedside. NAD.  side rails up.

## 2019-12-20 NOTE — ED Provider Notes (Signed)
[facility] EMERGENCY DEPARTMENT Provider Note   CSN: [id number] Arrival date & time: 12/20/19  0715     History Chief Complaint  Patient presents with  . Abdominal Pain    Antonio Holmes is a 59 y.o. male.  59 year old male with history of asthma who presents with abdominal pain.  This morning, he had a relatively sudden onset of abdominal pain that he describes as left-sided, sometimes going to his back, but also involving the right side of his abdomen currently.  He denies any associated nausea, vomiting, diarrhea, or constipation.  He had a normal bowel movement this morning.  No urinary symptoms, fevers, or URI symptoms.  No chest pain or shortness of breath.  He has never had pain like this before.  Denies history of kidney stones or diverticulitis.  No medications tried prior to arrival.  The history is provided by the patient.  Abdominal Pain      Past Medical History:  Diagnosis Date  . Asthma   . Cough     Patient Active Problem List   Diagnosis Date Noted  . Left knee pain 12/05/2019  . Allergic conjunctivitis 02/28/2019  . Asthma with acute exacerbation 06/22/2016  . Perennial and seasonal allergic rhinitis 02/17/2016  . History of esophageal reflux 02/17/2016  . Cough, persistent 02/17/2016  . Moderate persistent asthma 09/20/2013    Past Surgical History:  Procedure Laterality Date  . KNEE SURGERY    . WISDOM TOOTH EXTRACTION         Family History  Problem Relation Age of Onset  . Hypertension Father   . Allergic rhinitis Neg Hx   . Angioedema Neg Hx   . Asthma Neg Hx   . Atopy Neg Hx   . Eczema Neg Hx   . Immunodeficiency Neg Hx   . Urticaria Neg Hx     Social History   Tobacco Use  . Smoking status: Former Smoker    Packs/day: 1.00    Years: 0.50    Pack years: 0.50    Types: Cigarettes    Quit date: 08/24/1992    Years since quitting: 27.3  . Smokeless tobacco: Never Used  Substance Use Topics  . Alcohol use:  No    Alcohol/week: 0.0 standard drinks  . Drug use: No    Home Medications Prior to Admission medications   Medication Sig Start Date End Date Taking? Authorizing Provider  acetaminophen (TYLENOL) 325 MG tablet Take 650 mg by mouth every 6 (six) hours as needed.    [provider]  albuterol (PROAIR HFA) 108 (90 Base) MCG/ACT inhaler 2 puffs every 4 hours as needed only  if your can't catch your breath 12/05/19   Bobbitt, Heywood Iles, MD  Azelastine HCl 0.15 % SOLN Place 1-2 sprays into both nostrils 2 (two) times daily as needed. 04/26/18   Bobbitt, Heywood Iles, MD  budesonide-formoterol (SYMBICORT) 160-4.5 MCG/ACT inhaler Inhale 2 puffs into the lungs 2 (two) times daily. 12/05/19   Bobbitt, Heywood Iles, MD  fluticasone (FLONASE) 50 MCG/ACT nasal spray Place 2 sprays into both nostrils daily. 07/04/19   Bobbitt, Heywood Iles, MD  ketoconazole (NIZORAL) 2 % shampoo APPLY TO AFFECTED AREA; LEAVE FOR 3 TO 5 MINUTES BEFORE RINSING 06/29/19   [provider]  levocetirizine (XYZAL) 5 MG tablet Take 5 mg by mouth every evening.    [provider]  montelukast (SINGULAIR) 10 MG tablet Take 1 tablet (10 mg total) by mouth at bedtime. 12/05/19  Bobbitt, Heywood Iles, MD  ondansetron (ZOFRAN ODT) 4 MG disintegrating tablet Take 1 tablet (4 mg total) by mouth every 8 (eight) hours as needed for nausea or vomiting. 12/20/19   Ayliana Casciano, Ambrose Finland, MD  oxyCODONE-acetaminophen (PERCOCET) 5-325 MG tablet Take 1 tablet by mouth every 4 (four) hours as needed. 12/20/19   Sampson Self, Ambrose Finland, MD  SF 5000 PLUS 1.1 % CREA dental cream Use to brush teeth beforeYgoing to bed, expectorate, then floss. Do not eat, drink, or rinse for 30 minutes. 05/04/19   [provider]  tamsulosin (FLOMAX) 0.4 MG CAPS capsule Take 1 capsule (0.4 mg total) by mouth daily. 12/20/19   Averee Harb, Ambrose Finland, MD  Tiotropium Bromide Monohydrate (SPIRIVA RESPIMAT) 1.25 MCG/ACT AERS Inhale 2 puffs into the  lungs daily. 07/04/19   Bobbitt, Heywood Iles, MD    Allergies    Tramadol  Review of Systems   Review of Systems  Gastrointestinal: Positive for abdominal pain.   All other systems reviewed and are negative except that which was mentioned in HPI  Physical Exam Updated Vital Signs BP 116/67 (BP Location: Left Arm)   Pulse (!) 111   Temp 98 F (36.7 C) (Oral)   Resp 17   Ht 5\' 4"  (1.626 m)   Wt 83.9 kg   SpO2 98%   BMI 31.76 kg/m   Physical Exam Vitals and nursing note reviewed.  Constitutional:      General: He is not in acute distress.    Appearance: He is well-developed.     Comments: Uncomfortable, rubbing abdomen  HENT:     Head: Normocephalic and atraumatic.  Eyes:     Conjunctiva/sclera: Conjunctivae normal.  Cardiovascular:     Rate and Rhythm: Normal rate and regular rhythm.     Heart sounds: Normal heart sounds. No murmur.  Pulmonary:     Effort: Pulmonary effort is normal.     Breath sounds: Normal breath sounds.  Abdominal:     General: Bowel sounds are normal. There is no distension.     Palpations: Abdomen is soft.     Tenderness: There is abdominal tenderness.     Comments: Mildly distended, generalized TTP including LLQ, RUQ, RLQ; no rebound or guarding  Musculoskeletal:     Cervical back: Neck supple.  Skin:    General: Skin is warm and dry.  Neurological:     Mental Status: He is alert and oriented to person, place, and time.     Comments: Fluent speech  Psychiatric:        Judgment: Judgment normal.     ED Results / Procedures / Treatments   Labs (all labs ordered are listed, but only abnormal results are displayed) Labs Reviewed  COMPREHENSIVE METABOLIC PANEL - Abnormal; Notable for the following components:      Result Value   Glucose, Bld 132 (*)    Total Bilirubin 1.3 (*)    All other components within normal limits  CBC - Abnormal; Notable for the following components:   WBC 10.6 (*)    All other components within normal  limits  URINALYSIS, ROUTINE W REFLEX MICROSCOPIC - Abnormal; Notable for the following components:   Hgb urine dipstick LARGE (*)    Ketones, ur 20 (*)    RBC / HPF >50 (*)    Bacteria, UA RARE (*)    All other components within normal limits  LIPASE, BLOOD    EKG None  Radiology CT Abdomen Pelvis W Contrast  Result Date: 12/20/2019  CLINICAL DATA:  Abdominal pain beginning today, sudden onset LEFT lower quadrant pain this morning, now complaining of RIGHT lower quadrant RIGHT upper quadrant pain as well with abdominal distension, question intra-abdominal infection/abscess EXAM: CT ABDOMEN AND PELVIS WITH CONTRAST TECHNIQUE: Multidetector CT imaging of the abdomen and pelvis was performed using the standard protocol following bolus administration of intravenous contrast. Sagittal and coronal MPR images reconstructed from axial data set. CONTRAST:  OMNIPAQUE IOHEXOL 300 MG/ML SOLN IV. No oral contrast. COMPARISON:  None FINDINGS: Lower chest: Small fat attenuation pleural mass posteroinferior RIGHT hemithorax 2.6 x 1.0 cm consistent with a tiny lipoma. Lung bases otherwise clear. Hepatobiliary: Gallbladder and liver normal appearance Pancreas: Normal appearance Spleen: Normal appearance Adrenals/Urinary Tract: Adrenal glands normal appearance. Tiny BILATERAL renal cysts. RIGHT kidney, ureter, and bladder otherwise unremarkable. Mild delay in LEFT nephrogram with perinephric stranding and mild collecting system dilatation secondary to a 4 mm proximal LEFT ureteral calculus image 49. Remainder of LEFT ureter normal appearance. Stomach/Bowel: Normal appendix. Stomach and bowel loops normal appearance. Vascular/Lymphatic: Atherosclerotic calcification aorta. Vascular structures patent. No adenopathy. Reproductive: Prostate gland and seminal vesicles unremarkable Other: Tiny umbilical hernia containing fat. No free air or free fluid. Musculoskeletal: Osseous structures unremarkable. Fat attenuation  mass identified within the anterior proximal LEFT thigh, question associated with the distal RIGHT iliopsoas muscle/tendon, measures 9.7 x 7.3 cm image 95 and extends at least 9.7 cm length, beyond length of this study into proximal anterior RIGHT thigh. Several septations are identified. IMPRESSION: Mild LEFT hydronephrosis and hydroureter secondary to a 4 mm proximal LEFT ureteral calculus. Tiny umbilical hernia containing fat. Fat attenuation mass within the anterior proximal LEFT thigh question associated with the distal RIGHT iliopsoas muscle/tendon, measures at least 9.7 x 7.3 cm and extends at least 9.7 cm length, beyond length of this study into proximal anterior RIGHT thigh. 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 Aortic Atherosclerosis (ICD10-I70.0). Electronically Signed   By: Ulyses Southward M.D.   On: 12/20/2019 19:28    Procedures Procedures (including critical care time)  Medications Ordered in ED Medications  fentaNYL (SUBLIMAZE) injection 50 mcg (50 mcg Intravenous Given 12/20/19 0952)  sodium chloride flush (NS) 0.9 % injection 3 mL (3 mLs Intravenous Given 12/20/19 1542)  morphine 4 MG/ML injection 4 mg (4 mg Intravenous Given 12/20/19 1540)  ondansetron (ZOFRAN) injection 4 mg (4 mg Intravenous Given 12/20/19 1539)  morphine 4 MG/ML injection 4 mg (4 mg Intravenous Given 12/20/19 1738)  iohexol (OMNIPAQUE) 300 MG/ML solution 100 mL (100 mLs Intravenous Contrast Given 12/20/19 1839)  oxyCODONE-acetaminophen (PERCOCET/ROXICET) 5-325 MG per tablet 1 tablet (1 tablet Oral Given 12/20/19 2042)    ED Course  I have reviewed the triage vital signs and the nursing notes.  Pertinent labs & imaging results that were available during my care of the patient were reviewed by me and considered  in my medical decision making (see chart for details).    MDM Rules/Calculators/A&P                      Uncomfortable but non-toxic on exam, afebrile. DDx includes diverticulitis, kidney stone, bowel obstruction, less likely appendicitis given location and description. Considered AAA however pain has been lateral, no radiating pain down legs. Labs show reassuring CMP and lipase, WBC 10. CT shows L hydronephrosis w/ proximal 47mm  ureteral stone. UA w/ blood but no infection. Incidental finding of mass in L thigh w/ recommendation of outpatient MR. Discussed findings w/ patient. His pain is currently adequately controlled. I have recommended urology f/u and discussed supportive measures as well as return precautions.  Regarding his incidental finding, recommended PCP follow-up for further evaluation.  He voiced understanding. Final Clinical Impression(s) / ED Diagnoses Final diagnoses:  Left ureteral stone    Rx / DC Orders ED Discharge Orders         Ordered    ondansetron (ZOFRAN ODT) 4 MG disintegrating tablet  Every 8 hours PRN     12/20/19 2001    oxyCODONE-acetaminophen (PERCOCET) 5-325 MG tablet  Every 4 hours PRN     12/20/19 2007    tamsulosin (FLOMAX) 0.4 MG CAPS capsule  Daily     12/20/19 2027           Melisia Leming, Wenda Overland, MD 12/20/19 2212

## 2019-12-20 NOTE — ED Triage Notes (Signed)
Pt arrived by [facility] for LLQ pain that started this morning.

## 2019-12-20 NOTE — Discharge Instructions (Signed)
INCIDENTAL FINDING: FOLLOW UP WITH PRIMARY CARE DOCTOR  Fat attenuation mass within the anterior proximal LEFT thigh  question associated with the distal RIGHT iliopsoas muscle/tendon,  measures at least 9.7 x 7.3 cm and extends at least 9.7 cm length,  beyond length of this study into proximal anterior RIGHT thigh.   Finding may represent either an intramuscular or intermuscular  lipoma though due to presence of multiple septations, further  characterization by non emergent MR imaging with and without  contrast recommended to exclude well differentiated liposarcoma.

## 2019-12-20 NOTE — ED Notes (Signed)
Pt called tech over to him. Pt stated that he is in pain. Notified Triage nurse.

## 2019-12-20 NOTE — ED Notes (Addendum)
Patient verbalizes understanding of discharge instructions. Opportunity for questioning and answers were provided. Armband removed by staff, pt discharged from ED via wheelchair.  

## 2019-12-22 ENCOUNTER — Other Ambulatory Visit: Payer: Self-pay

## 2019-12-22 ENCOUNTER — Emergency Department (HOSPITAL_COMMUNITY)
Admission: EM | Admit: 2019-12-22 | Discharge: 2019-12-22 | Disposition: A | Discharge status: Home / Self Care | Payer: BC Managed Care – PPO | Attending: Emergency Medicine | Admitting: Emergency Medicine

## 2019-12-22 ENCOUNTER — Encounter (HOSPITAL_COMMUNITY): Payer: Self-pay | Admitting: Emergency Medicine

## 2019-12-22 DIAGNOSIS — N201 Calculus of ureter: Secondary | ICD-10-CM | POA: Insufficient documentation

## 2019-12-22 DIAGNOSIS — M549 Dorsalgia, unspecified: Secondary | ICD-10-CM | POA: Diagnosis not present

## 2019-12-22 DIAGNOSIS — R1032 Left lower quadrant pain: Secondary | ICD-10-CM | POA: Diagnosis present

## 2019-12-22 LAB — BASIC METABOLIC PANEL
Anion gap: 10 (ref 5–15)
BUN: 18 mg/dL (ref 6–20)
CO2: 28 mmol/L (ref 22–32)
Calcium: 8.9 mg/dL (ref 8.9–10.3)
Chloride: 100 mmol/L (ref 98–111)
Creatinine, Ser: 0.91 mg/dL (ref 0.61–1.24)
GFR calc Af Amer: >60 mL/min (ref >60)
GFR calc non Af Amer: >60 mL/min (ref >60)
Glucose, Bld: 108 mg/dL — ABNORMAL HIGH (ref 70–99)
Potassium: 3.4 mmol/L — ABNORMAL LOW (ref 3.5–5.1)
Sodium: 138 mmol/L (ref 135–145)

## 2019-12-22 LAB — CBC WITH DIFFERENTIAL/PLATELET
Abs Immature Granulocytes: 0.04 10*3/uL (ref 0.00–0.07)
Basophils Absolute: 0 10*3/uL (ref 0.0–0.1)
Basophils Relative: 1 %
Eosinophils Absolute: 0 10*3/uL (ref 0.0–0.5)
Eosinophils Relative: 0 %
HCT: 47.2 % (ref 39.0–52.0)
Hemoglobin: 15.4 g/dL (ref 13.0–17.0)
Immature Granulocytes: 1 %
Lymphocytes Relative: 12 %
Lymphs Abs: 1 10*3/uL (ref 0.7–4.0)
MCH: 32.2 pg (ref 26.0–34.0)
MCHC: 32.6 g/dL (ref 30.0–36.0)
MCV: 98.7 fL (ref 80.0–100.0)
Monocytes Absolute: 0.8 10*3/uL (ref 0.1–1.0)
Monocytes Relative: 10 %
Neutro Abs: 6.9 10*3/uL (ref 1.7–7.7)
Neutrophils Relative %: 76 %
Platelets: 242 10*3/uL (ref 150–400)
RBC: 4.78 MIL/uL (ref 4.22–5.81)
RDW: 13.2 % (ref 11.5–15.5)
WBC: 8.9 10*3/uL (ref 4.0–10.5)
nRBC: 0 % (ref 0.0–0.2)

## 2019-12-22 LAB — URINALYSIS, ROUTINE W REFLEX MICROSCOPIC
Bacteria, UA: NONE SEEN
Bilirubin Urine: NEGATIVE
Glucose, UA: NEGATIVE mg/dL
Ketones, ur: NEGATIVE mg/dL
Leukocytes,Ua: NEGATIVE
Nitrite: NEGATIVE
Protein, ur: NEGATIVE mg/dL
Specific Gravity, Urine: 1.013 (ref 1.005–1.030)
pH: 6 (ref 5.0–8.0)

## 2019-12-22 MED ORDER — OXYCODONE-ACETAMINOPHEN 5-325 MG PO TABS: 1.0000 | ORAL_TABLET | ORAL | 0 refills | Status: DC | PRN

## 2019-12-22 MED ORDER — BISACODYL 5 MG PO TBEC
5.0000 mg | DELAYED_RELEASE_TABLET | Freq: Every day | ORAL | 0 refills | Status: DC | PRN
Start: 2019-12-22 — End: 2021-06-04

## 2019-12-22 MED ORDER — MORPHINE SULFATE (PF) 4 MG/ML IV SOLN
4.0000 mg | Freq: Once | INTRAVENOUS | Status: AC
  Administered 2019-12-22: 08:00:00 4 mg via INTRAVENOUS
  Filled 2019-12-22: qty 1

## 2019-12-22 MED ORDER — KETOROLAC TROMETHAMINE 30 MG/ML IJ SOLN
30.0000 mg | Freq: Once | INTRAMUSCULAR | Status: AC
  Administered 2019-12-22: 30 mg via INTRAVENOUS
  Filled 2019-12-22: qty 1

## 2019-12-22 NOTE — Discharge Instructions (Signed)
You are seen in the emergency department for continued flank pain from your kidney stone.  Your lab work was unremarkable.  It will be important for you to contact urology for close follow-up.  If you have worsening symptoms over the weekend please go to [facility] long [facility] in [place].  You can use ibuprofen 3 times a day along with the oxycodone.  We are also prescribing you a stool softener and if this does not help you can use one half bottle of magnesium citrate which she can get at the pharmacy.  If you experience any fever or uncontrolled pain please return to the emergency department.

## 2019-12-22 NOTE — ED Provider Notes (Signed)
[facility] EMERGENCY DEPARTMENT Provider Note   CSN: [id number] Arrival date & time: 12/22/19  7564     History Chief Complaint  Patient presents with  . Flank Pain    JAREK LONGTON is a 59 y.o. male.  He is here with complaint of left flank pain that started 2 days ago.  He said it was acute onset severe in nature and radiated down his left arm.  He took an ambulance to [facility] where ultimately he was diagnosed with a 4 mm proximal left kidney stone.  He was given oxycodone, tamsulosin and Zofran with some relief of his pain.  He said he has been constipated since taking the medicine.  His pain continue to bother him for the last 2 days and he presents here with uncontrolled flank pain.  No fevers.  No other complaints.  The history is provided by the patient.  Flank Pain This is a new problem. The current episode started 2 days ago. The problem occurs constantly. The problem has not changed since onset.Associated symptoms include abdominal pain. Pertinent negatives include no chest pain, no headaches and no shortness of breath. Nothing aggravates the symptoms. The symptoms are relieved by narcotics. The treatment provided mild relief.       Past Medical History:  Diagnosis Date  . Asthma   . Cough     Patient Active Problem List   Diagnosis Date Noted  . Left knee pain 12/05/2019  . Allergic conjunctivitis 02/28/2019  . Asthma with acute exacerbation 06/22/2016  . Perennial and seasonal allergic rhinitis 02/17/2016  . History of esophageal reflux 02/17/2016  . Cough, persistent 02/17/2016  . Moderate persistent asthma 09/20/2013    Past Surgical History:  Procedure Laterality Date  . KNEE SURGERY    . WISDOM TOOTH EXTRACTION         Family History  Problem Relation Age of Onset  . Hypertension Father   . Allergic rhinitis Neg Hx   . Angioedema Neg Hx   . Asthma Neg Hx   . Atopy Neg Hx   . Eczema Neg Hx   . Immunodeficiency Neg Hx   . Urticaria Neg Hx      Social History   Tobacco Use  . Smoking status: Former Smoker    Packs/day: 1.00    Years: 0.50    Pack years: 0.50    Types: Cigarettes    Quit date: 08/24/1992    Years since quitting: 27.3  . Smokeless tobacco: Never Used  Substance Use Topics  . Alcohol use: No    Alcohol/week: 0.0 standard drinks  . Drug use: No    Home Medications Prior to Admission medications   Medication Sig Start Date End Date Taking? Authorizing Provider  acetaminophen (TYLENOL) 325 MG tablet Take 650 mg by mouth every 6 (six) hours as needed.    [provider]  albuterol (PROAIR HFA) 108 (90 Base) MCG/ACT inhaler 2 puffs every 4 hours as needed only  if your can't catch your breath 12/05/19   Bobbitt, Heywood Iles, MD  Azelastine HCl 0.15 % SOLN Place 1-2 sprays into both nostrils 2 (two) times daily as needed. 04/26/18   Bobbitt, Heywood Iles, MD  budesonide-formoterol (SYMBICORT) 160-4.5 MCG/ACT inhaler Inhale 2 puffs into the lungs 2 (two) times daily. 12/05/19   Bobbitt, Heywood Iles, MD  fluticasone (FLONASE) 50 MCG/ACT nasal spray Place 2 sprays into both nostrils daily. 07/04/19   Bobbitt, Heywood Iles, MD  ketoconazole (NIZORAL) 2 % shampoo APPLY  TO AFFECTED AREA; LEAVE FOR 3 TO 5 MINUTES BEFORE RINSING 06/29/19   [provider]  levocetirizine (XYZAL) 5 MG tablet Take 5 mg by mouth every evening.    [provider]  montelukast (SINGULAIR) 10 MG tablet Take 1 tablet (10 mg total) by mouth at bedtime. 12/05/19   Bobbitt, Sedalia Muta, MD  ondansetron (ZOFRAN ODT) 4 MG disintegrating tablet Take 1 tablet (4 mg total) by mouth every 8 (eight) hours as needed for nausea or vomiting. 12/20/19   Little, Wenda Overland, MD  oxyCODONE-acetaminophen (PERCOCET) 5-325 MG tablet Take 1 tablet by mouth every 4 (four) hours as needed. 12/20/19   Little, Wenda Overland, MD  SF 5000 PLUS 1.1 % CREA dental cream Use to brush teeth beforeYgoing to bed, expectorate, then floss. Do not eat,  drink, or rinse for 30 minutes. 05/04/19   [provider]  tamsulosin (FLOMAX) 0.4 MG CAPS capsule Take 1 capsule (0.4 mg total) by mouth daily. 12/20/19   Little, Wenda Overland, MD  Tiotropium Bromide Monohydrate (SPIRIVA RESPIMAT) 1.25 MCG/ACT AERS Inhale 2 puffs into the lungs daily. 07/04/19   Bobbitt, Sedalia Muta, MD    Allergies    Tramadol  Review of Systems   Review of Systems  Constitutional: Negative for fever.  HENT: Negative for sore throat.   Eyes: Negative for visual disturbance.  Respiratory: Negative for shortness of breath.   Cardiovascular: Negative for chest pain.  Gastrointestinal: Positive for abdominal pain.  Genitourinary: Positive for flank pain. Negative for dysuria.  Musculoskeletal: Positive for back pain.  Skin: Negative for rash.  Neurological: Negative for headaches.    Physical Exam Updated Vital Signs BP (!) 129/91   Pulse 91   Temp 97.8 F (36.6 C)   Resp 18   Ht 5\' 4"  (1.626 m)   Wt 83.9 kg   SpO2 97%   BMI 31.75 kg/m   Physical Exam Vitals and nursing note reviewed.  Constitutional:      Appearance: He is well-developed.  HENT:     Head: Normocephalic and atraumatic.  Eyes:     Conjunctiva/sclera: Conjunctivae normal.  Cardiovascular:     Rate and Rhythm: Normal rate and regular rhythm.     Heart sounds: No murmur.  Pulmonary:     Effort: Pulmonary effort is normal. No respiratory distress.     Breath sounds: Normal breath sounds.  Abdominal:     Palpations: Abdomen is soft.     Tenderness: There is no abdominal tenderness. There is no guarding or rebound.  Musculoskeletal:        General: No deformity or signs of injury. Normal range of motion.     Cervical back: Neck supple.  Skin:    General: Skin is warm and dry.     Capillary Refill: Capillary refill takes less than 2 seconds.  Neurological:     General: No focal deficit present.     Mental Status: He is alert.     ED Results / Procedures / Treatments    Labs (all labs ordered are listed, but only abnormal results are displayed) Labs Reviewed  BASIC METABOLIC PANEL - Abnormal; Notable for the following components:      Result Value   Potassium 3.4 (*)    Glucose, Bld 108 (*)    All other components within normal limits  URINALYSIS, ROUTINE W REFLEX MICROSCOPIC - Abnormal; Notable for the following components:   Hgb urine dipstick MODERATE (*)    All other components within normal  limits  CBC WITH DIFFERENTIAL/PLATELET    EKG None  Radiology CT Abdomen Pelvis W Contrast  Result Date: 12/20/2019 CLINICAL DATA:  Abdominal pain beginning today, sudden onset LEFT lower quadrant pain this morning, now complaining of RIGHT lower quadrant RIGHT upper quadrant pain as well with abdominal distension, question intra-abdominal infection/abscess EXAM: CT ABDOMEN AND PELVIS WITH CONTRAST TECHNIQUE: Multidetector CT imaging of the abdomen and pelvis was performed using the standard protocol following bolus administration of intravenous contrast. Sagittal and coronal MPR images reconstructed from axial data set. CONTRAST:  OMNIPAQUE IOHEXOL 300 MG/ML SOLN IV. No oral contrast. COMPARISON:  None FINDINGS: Lower chest: Small fat attenuation pleural mass posteroinferior RIGHT hemithorax 2.6 x 1.0 cm consistent with a tiny lipoma. Lung bases otherwise clear. Hepatobiliary: Gallbladder and liver normal appearance Pancreas: Normal appearance Spleen: Normal appearance Adrenals/Urinary Tract: Adrenal glands normal appearance. Tiny BILATERAL renal cysts. RIGHT kidney, ureter, and bladder otherwise unremarkable. Mild delay in LEFT nephrogram with perinephric stranding and mild collecting system dilatation secondary to a 4 mm proximal LEFT ureteral calculus image 49. Remainder of LEFT ureter normal appearance. Stomach/Bowel: Normal appendix. Stomach and bowel loops normal appearance. Vascular/Lymphatic: Atherosclerotic calcification aorta. Vascular structures  patent. No adenopathy. Reproductive: Prostate gland and seminal vesicles unremarkable Other: Tiny umbilical hernia containing fat. No free air or free fluid. Musculoskeletal: Osseous structures unremarkable. Fat attenuation mass identified within the anterior proximal LEFT thigh, question associated with the distal RIGHT iliopsoas muscle/tendon, measures 9.7 x 7.3 cm image 95 and extends at least 9.7 cm length, beyond length of this study into proximal anterior RIGHT thigh. Several septations are identified. IMPRESSION: Mild LEFT hydronephrosis and hydroureter secondary to a 4 mm proximal LEFT ureteral calculus. Tiny umbilical hernia containing fat. Fat attenuation mass within the anterior proximal LEFT thigh question associated with the distal RIGHT iliopsoas muscle/tendon, measures at least 9.7 x 7.3 cm and extends at least 9.7 cm length, beyond length of this study into proximal anterior RIGHT thigh. 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 Aortic Atherosclerosis (ICD10-I70.0). Electronically Signed   By: Ulyses Southward M.D.   On: 12/20/2019 19:28    Procedures Procedures (including critical care time)  Medications Ordered in ED Medications  ketorolac (TORADOL) 30 MG/ML injection 30 mg (has no administration in time range)  morphine 4 MG/ML injection 4 mg (has no administration in time range)    ED Course  I have reviewed the triage vital signs and the nursing notes.  Pertinent labs & imaging results that were available during my care of the patient were reviewed by me and considered in my medical decision making (see chart for details).  Clinical Course as of Dec 22 1847  Fri Dec 22, 2019  3825 Pain completely resolved after morphine Toradol.  Labs show normal white count normal hemoglobin  normal renal function.  Potassium mildly low at 3.4.  Urinalysis without signs of infection.  Patient states he is only 2 oxycodone left and those do not really help all that much anyway.   [MB]  0840 Discussed with Dr. Marlou Porch [facility].  He recommended that the patient stay the course and continue to try to pass this on his own.  Recommends that the patient go to [place] [facility] if he continues to have symptoms   [MB]    Clinical Course User Index [MB] Meridee Score  C, MD   MDM Rules/Calculators/A&P                     This patient complains of left-sided back and flank pain; this involves an extensive number of treatment Options and is a complaint that carries with it a high risk of complications and Morbidity. The differential includes kidney stone, pyelonephritis, obstruction  I ordered, reviewed and interpreted labs, which included normal white count normal hemoglobin normal chemistries other than a potassium of 3.4.  Urinalysis with no signs of infection I ordered medication morphine Toradol fluids Zofran  Additional history obtained from patient's wife Previous records obtained and reviewed in epic including his last ER visit demonstrating a 4 mm kidney stone I consulted urology Dr. Marlou Porch and discussed lab and imaging findings  Critical Interventions: None  After the interventions stated above, I reevaluated the patient and found patient to be pain-free and improved.  Discussed urology recommendations for patient to continue to try to manage and passed stone on her own.  Return instructions discussed including following up at [facility] if recurrent symptoms causing him to come to the emergency room.  Refilled his pain medications.  Also encouraged medication for constipation.   Final Clinical Impression(s) / ED Diagnoses Final diagnoses:  Ureterolithiasis    Rx / DC Orders ED Discharge Orders         Ordered    oxyCODONE-acetaminophen (PERCOCET) 5-325 MG tablet   Every 4 hours PRN     12/22/19 0849    bisacodyl (DULCOLAX) 5 MG EC tablet  Daily PRN     12/22/19 0849           Terrilee Files, MD 12/22/19 [phone]

## 2019-12-22 NOTE — ED Triage Notes (Signed)
Pt c/o left flank pain that started on 12/20/19. Pt was seen at [facility] ED and was told to follow up with urology, but states he has not.

## 2019-12-25 ENCOUNTER — Telehealth: Payer: Self-pay | Admitting: Urology

## 2019-12-25 NOTE — Telephone Encounter (Signed)
Dr. Ronne Binning said we could add STONE PT ONLY at 3:45 on this Monday/wed.  Lets place him this wed at 3:45.  Any new stone referrals and no where to place add to 3:45 slot

## 2019-12-25 NOTE — Telephone Encounter (Signed)
Pt called and states he was seen in er last week. He is having issues with kidney stones. He asked to be seen this week.

## 2019-12-28 NOTE — Telephone Encounter (Signed)
Okay 

## 2019-12-28 NOTE — Telephone Encounter (Signed)
I called this patient on 12/25/19 and he has not returned my call as of 12/28/19

## 2020-01-03 ENCOUNTER — Other Ambulatory Visit: Payer: Self-pay | Admitting: Family Medicine

## 2020-01-03 DIAGNOSIS — R2241 Localized swelling, mass and lump, right lower limb: Secondary | ICD-10-CM

## 2020-01-09 NOTE — Progress Notes (Signed)
Vials exp 01-07-21

## 2020-01-10 ENCOUNTER — Ambulatory Visit (INDEPENDENT_AMBULATORY_CARE_PROVIDER_SITE_OTHER): Payer: BC Managed Care – PPO

## 2020-01-10 DIAGNOSIS — J309 Allergic rhinitis, unspecified: Secondary | ICD-10-CM | POA: Diagnosis not present

## 2020-01-11 DIAGNOSIS — J301 Allergic rhinitis due to pollen: Secondary | ICD-10-CM

## 2020-02-01 ENCOUNTER — Ambulatory Visit
Admission: RE | Admit: 2020-02-01 | Discharge: 2020-02-01 | Disposition: A | Discharge status: Home / Self Care | Payer: BC Managed Care – PPO | Source: Ambulatory Visit | Attending: Family Medicine | Admitting: Family Medicine

## 2020-02-01 DIAGNOSIS — R2241 Localized swelling, mass and lump, right lower limb: Secondary | ICD-10-CM

## 2020-02-01 MED ORDER — GADOBENATE DIMEGLUMINE 529 MG/ML IV SOLN
17.0000 mL | Freq: Once | INTRAVENOUS | Status: AC | PRN
  Administered 2020-02-01: 17 mL via INTRAVENOUS

## 2020-02-07 ENCOUNTER — Ambulatory Visit (INDEPENDENT_AMBULATORY_CARE_PROVIDER_SITE_OTHER): Payer: BC Managed Care – PPO

## 2020-02-07 DIAGNOSIS — J309 Allergic rhinitis, unspecified: Secondary | ICD-10-CM

## 2020-03-06 ENCOUNTER — Ambulatory Visit (INDEPENDENT_AMBULATORY_CARE_PROVIDER_SITE_OTHER): Payer: BC Managed Care – PPO

## 2020-03-06 DIAGNOSIS — J309 Allergic rhinitis, unspecified: Secondary | ICD-10-CM | POA: Diagnosis not present

## 2020-04-03 ENCOUNTER — Ambulatory Visit (INDEPENDENT_AMBULATORY_CARE_PROVIDER_SITE_OTHER): Payer: BC Managed Care – PPO

## 2020-04-03 DIAGNOSIS — J309 Allergic rhinitis, unspecified: Secondary | ICD-10-CM | POA: Diagnosis not present

## 2020-04-08 NOTE — Progress Notes (Signed)
Follow Up Note  RE: Antonio Holmes MRN: [id number] DOB: 30-Dec-1960 Date of Office Visit: 04/09/2020  Referring provider: Gweneth Dimitri, MD Primary care provider: Gweneth Dimitri, MD  Chief Complaint: Follow-up, Allergies (no complaints), and Asthma (no complaints)  History of Present Illness: I had the pleasure of seeing Antonio Holmes for a follow up visit at the [facility] on 04/09/2020. He is a 59 y.o. male, who is being followed for asthma, allergic rhinitis on AIT. His previous allergy office visit was on 12/05/2019 with Dr. Nunzio Cobbs. Today is a regular follow up visit. Up to date with COVID-19 vaccine: yes  Moderate persistent asthma Denies any SOB, coughing, wheezing, chest tightness, nocturnal awakenings, ER/urgent care visits or prednisone use since the last visit. Usually coughing flares in the winter months.  Currently Symbicort 2 puffs twice a day with spacer and rinses mouth after each use and montelukast daily. Used albuterol a few times per month after heavy exertion outdoors with good benefit. Used Spiriva during winter months to help with the coughing.   Perennial and seasonal allergic rhinitis Currently on allergy injections and doing well on it.  Uses Xyzal daily and Flonase as needed with good benefit.   Assessment and Plan: Antonio Holmes is a 59 y.o. male with: Moderate persistent asthma Doing well with below regimen. Sometimes coughing flares from October through February. Used Spiriva last year with good benefit. . Daily controller medication(s): continue Symbicort 2 puffs twice a day with spacer and rinse mouth afterwards. . Continue montelukast 10mg  daily.  . ADD on Spiriva 1.67mcg 2 puffs once a day starting in October.  o See if helps with your cough in the winter months.  . May use albuterol rescue inhaler 2 puffs every 4 to 6 hours as needed for shortness of breath, chest tightness, coughing, and wheezing. May use albuterol rescue  inhaler 2 puffs 5 to 15 minutes prior to strenuous physical activities. Monitor frequency of use.  . Will get spirometry at next visit instead of today due to COVID-19 pandemic and trying to minimize any type of aerosolizing procedures at this time in the office.   Perennial and seasonal allergic rhinitis Past history - 2017 skin testing was positive to grass, tree, weed, ragweed, mold dust mites, cat, dog and cockroach. Started AIT on 03/31/2016 (Mold-DMite-Cat-Dog-CR and 1-Grass-Weed-Tree)  Interim history - doing well with below regimen.   Continue environmental control measures.  Continue allergy injections.  Continue montelukast 10mg  daily.  May use Flonase (fluticasone) nasal spray 1 spray per nostril twice a day as needed for nasal congestion.   Nasal saline spray (i.e., Simply Saline) or nasal saline lavage (i.e., NeilMed) is recommended as needed and prior to medicated nasal sprays.  Return in about 4 months (around 08/09/2020).  Meds ordered this encounter  Medications  . budesonide-formoterol (SYMBICORT) 160-4.5 MCG/ACT inhaler    Sig: Inhale 2 puffs into the lungs 2 (two) times daily. with spacer and rinse mouth afterwards.    Dispense:  10.2 g    Refill:  5  . montelukast (SINGULAIR) 10 MG tablet    Sig: Take 1 tablet (10 mg total) by mouth at bedtime.    Dispense:  30 tablet    Refill:  5  . albuterol (PROAIR HFA) 108 (90 Base) MCG/ACT inhaler    Sig: Inhale 2 puffs into the lungs every 4 (four) hours as needed for wheezing or shortness of breath (coughing fits). 2 puffs every 4 hours as  needed only  if your can't catch your breath    Dispense:  18 g    Refill:  1   Lab Orders  No laboratory test(s) ordered today    Diagnostics: None.  Medication List:  Current Outpatient Medications  Medication Sig Dispense Refill  . acetaminophen (TYLENOL) 325 MG tablet Take 650 mg by mouth every 6 (six) hours as needed.    Marland Kitchen albuterol (PROAIR HFA) 108 (90 Base) MCG/ACT  inhaler Inhale 2 puffs into the lungs every 4 (four) hours as needed for wheezing or shortness of breath (coughing fits). 2 puffs every 4 hours as needed only  if your can't catch your breath 18 g 1  . Azelastine HCl 0.15 % SOLN Place 1-2 sprays into both nostrils 2 (two) times daily as needed. 30 mL 5  . bisacodyl (DULCOLAX) 5 MG EC tablet Take 1 tablet (5 mg total) by mouth daily as needed for moderate constipation. 14 tablet 0  . budesonide-formoterol (SYMBICORT) 160-4.5 MCG/ACT inhaler Inhale 2 puffs into the lungs 2 (two) times daily. with spacer and rinse mouth afterwards. 10.2 g 5  . fluticasone (FLONASE) 50 MCG/ACT nasal spray Place 2 sprays into both nostrils daily. 16 g 5  . ketoconazole (NIZORAL) 2 % shampoo APPLY TO AFFECTED AREA; LEAVE FOR 3 TO 5 MINUTES BEFORE RINSING    . levocetirizine (XYZAL) 5 MG tablet Take 5 mg by mouth every evening.    . montelukast (SINGULAIR) 10 MG tablet Take 1 tablet (10 mg total) by mouth at bedtime. 30 tablet 5  . ondansetron (ZOFRAN ODT) 4 MG disintegrating tablet Take 1 tablet (4 mg total) by mouth every 8 (eight) hours as needed for nausea or vomiting. 6 tablet 0  . oxyCODONE-acetaminophen (PERCOCET) 5-325 MG tablet Take 1-2 tablets by mouth every 4 (four) hours as needed. 20 tablet 0  . SF 5000 PLUS 1.1 % CREA dental cream Use to brush teeth beforeYgoing to bed, expectorate, then floss. Do not eat, drink, or rinse for 30 minutes.    . tamsulosin (FLOMAX) 0.4 MG CAPS capsule Take 1 capsule (0.4 mg total) by mouth daily. 10 capsule 0  . Tiotropium Bromide Monohydrate (SPIRIVA RESPIMAT) 1.25 MCG/ACT AERS Inhale 2 puffs into the lungs daily. (Patient not taking: Reported on 04/09/2020) 4 g 5   No current facility-administered medications for this visit.   Allergies: Allergies  Allergen Reactions  . Tramadol     "made my skin crawl"   I reviewed his past medical history, social history, family history, and environmental history and no significant  changes have been reported from his previous visit.  Review of Systems  Constitutional: Negative for appetite change, chills, fever and unexpected weight change.  HENT: Negative for congestion and rhinorrhea.   Eyes: Negative for itching.  Respiratory: Negative for cough, chest tightness, shortness of breath and wheezing.   Gastrointestinal: Negative for abdominal pain.  Skin: Negative for rash.  Allergic/Immunologic: Positive for environmental allergies.  Neurological: Negative for headaches.   Objective: BP 112/80   Pulse 77   Temp (!) 97 F (36.1 C) (Oral)   Resp 16   Wt 185 lb 12.8 oz (84.3 kg)   SpO2 97%   BMI 31.89 kg/m  Body mass index is 31.89 kg/m. Physical Exam Vitals and nursing note reviewed.  Constitutional:      Appearance: Normal appearance. He is well-developed.  HENT:     Head: Normocephalic and atraumatic.     Right Ear: External ear normal.  Left Ear: External ear normal.     Nose: Nose normal.     Mouth/Throat:     Mouth: Mucous membranes are moist.     Pharynx: Oropharynx is clear.  Eyes:     Conjunctiva/sclera: Conjunctivae normal.  Cardiovascular:     Rate and Rhythm: Normal rate and regular rhythm.     Heart sounds: Normal heart sounds. No murmur heard.   Pulmonary:     Effort: Pulmonary effort is normal.     Breath sounds: Normal breath sounds. No wheezing, rhonchi or rales.  Musculoskeletal:     Cervical back: Neck supple.  Skin:    General: Skin is warm.     Findings: No rash.  Neurological:     Mental Status: He is alert and oriented to person, place, and time.  Psychiatric:        Behavior: Behavior normal.    Previous notes and tests were reviewed. The plan was reviewed with the patient/family, and all questions/concerned were addressed.  It was my pleasure to see Koby today and participate in his care. Please feel free to contact me with any questions or concerns.  Sincerely,  Wyline Mood, DO Allergy &  Immunology  [facility] office: [phone] [facility] office: [phone] [place] office: [phone]

## 2020-04-09 ENCOUNTER — Ambulatory Visit: Payer: BC Managed Care – PPO | Admitting: Allergy

## 2020-04-09 ENCOUNTER — Other Ambulatory Visit: Payer: Self-pay

## 2020-04-09 ENCOUNTER — Encounter: Payer: Self-pay | Admitting: Allergy

## 2020-04-09 VITALS — BP 112/80 | HR 77 | Temp 97.0°F | Resp 16 | Wt 185.8 lb

## 2020-04-09 DIAGNOSIS — J4540 Moderate persistent asthma, uncomplicated: Secondary | ICD-10-CM

## 2020-04-09 DIAGNOSIS — J3089 Other allergic rhinitis: Secondary | ICD-10-CM

## 2020-04-09 MED ORDER — ALBUTEROL SULFATE HFA 108 (90 BASE) MCG/ACT IN AERS: 2.0000 | INHALATION_SPRAY | RESPIRATORY_TRACT | 1 refills | Status: DC | PRN

## 2020-04-09 MED ORDER — MONTELUKAST SODIUM 10 MG PO TABS: 10.0000 mg | ORAL_TABLET | Freq: Every day | ORAL | 5 refills | Status: DC

## 2020-04-09 MED ORDER — BUDESONIDE-FORMOTEROL FUMARATE 160-4.5 MCG/ACT IN AERO: 2.0000 | INHALATION_SPRAY | Freq: Two times a day (BID) | RESPIRATORY_TRACT | 5 refills | Status: DC

## 2020-04-09 NOTE — Patient Instructions (Addendum)
Moderate persistent asthma . Daily controller medication(s): continue Symbicort 2 puffs twice a day with spacer and rinse mouth afterwards. . Continue montelukast 10mg  daily.  . ADD on Spiriva 1.54mcg 2 puffs once a day starting in October.  o See if helps with your cough in the winter months.  . May use albuterol rescue inhaler 2 puffs every 4 to 6 hours as needed for shortness of breath, chest tightness, coughing, and wheezing. May use albuterol rescue inhaler 2 puffs 5 to 15 minutes prior to strenuous physical activities. Monitor frequency of use.  . Asthma control goals:  o Full participation in all desired activities (may need albuterol before activity) o Albuterol use two times or less a week on average (not counting use with activity) o Cough interfering with sleep two times or less a month o Oral steroids no more than once a year o No hospitalizations  Perennial and seasonal allergic rhinitis  2017 skin testing was positive to grass, tree, weed, ragweed, mold dust mites, cat, dog and cockroach.  Continue environmental control measures.  Continue allergy injections.  Continue montelukast 10mg  daily.  May use Flonase (fluticasone) nasal spray 1 spray per nostril twice a day as needed for nasal congestion.   Nasal saline spray (i.e., Simply Saline) or nasal saline lavage (i.e., NeilMed) is recommended as needed and prior to medicated nasal sprays.  Follow up in 4 months or sooner if needed.

## 2020-04-09 NOTE — Assessment & Plan Note (Signed)
Past history - 2017 skin testing was positive to grass, tree, weed, ragweed, mold dust mites, cat, dog and cockroach. Started AIT on 03/31/2016 (Mold-DMite-Cat-Dog-CR and 1-Grass-Weed-Tree)  Interim history - doing well with below regimen.   Continue environmental control measures.  Continue allergy injections.  Continue montelukast 10mg  daily.  May use Flonase (fluticasone) nasal spray 1 spray per nostril twice a day as needed for nasal congestion.   Nasal saline spray (i.e., Simply Saline) or nasal saline lavage (i.e., NeilMed) is recommended as needed and prior to medicated nasal sprays.

## 2020-04-09 NOTE — Assessment & Plan Note (Signed)
Doing well with below regimen. Sometimes coughing flares from October through February. Used Spiriva last year with good benefit. . Daily controller medication(s): continue Symbicort 2 puffs twice a day with spacer and rinse mouth afterwards. . Continue montelukast 10mg  daily.  . ADD on Spiriva 1.50mcg 2 puffs once a day starting in October.  o See if helps with your cough in the winter months.  . May use albuterol rescue inhaler 2 puffs every 4 to 6 hours as needed for shortness of breath, chest tightness, coughing, and wheezing. May use albuterol rescue inhaler 2 puffs 5 to 15 minutes prior to strenuous physical activities. Monitor frequency of use.  . Will get spirometry at next visit instead of today due to COVID-19 pandemic and trying to minimize any type of aerosolizing procedures at this time in the office.

## 2020-05-01 ENCOUNTER — Ambulatory Visit (INDEPENDENT_AMBULATORY_CARE_PROVIDER_SITE_OTHER): Payer: BC Managed Care – PPO

## 2020-05-01 DIAGNOSIS — J309 Allergic rhinitis, unspecified: Secondary | ICD-10-CM

## 2020-05-08 ENCOUNTER — Ambulatory Visit (INDEPENDENT_AMBULATORY_CARE_PROVIDER_SITE_OTHER): Payer: BC Managed Care – PPO

## 2020-05-08 DIAGNOSIS — J309 Allergic rhinitis, unspecified: Secondary | ICD-10-CM | POA: Diagnosis not present

## 2020-05-15 ENCOUNTER — Ambulatory Visit (INDEPENDENT_AMBULATORY_CARE_PROVIDER_SITE_OTHER): Payer: BC Managed Care – PPO

## 2020-05-15 DIAGNOSIS — J309 Allergic rhinitis, unspecified: Secondary | ICD-10-CM | POA: Diagnosis not present

## 2020-05-22 ENCOUNTER — Ambulatory Visit (INDEPENDENT_AMBULATORY_CARE_PROVIDER_SITE_OTHER): Payer: BC Managed Care – PPO

## 2020-05-22 DIAGNOSIS — J309 Allergic rhinitis, unspecified: Secondary | ICD-10-CM | POA: Diagnosis not present

## 2020-05-29 ENCOUNTER — Ambulatory Visit (INDEPENDENT_AMBULATORY_CARE_PROVIDER_SITE_OTHER): Payer: BC Managed Care – PPO

## 2020-05-29 DIAGNOSIS — J309 Allergic rhinitis, unspecified: Secondary | ICD-10-CM

## 2020-06-26 ENCOUNTER — Ambulatory Visit (INDEPENDENT_AMBULATORY_CARE_PROVIDER_SITE_OTHER): Payer: BC Managed Care – PPO

## 2020-06-26 DIAGNOSIS — J309 Allergic rhinitis, unspecified: Secondary | ICD-10-CM

## 2020-07-24 ENCOUNTER — Ambulatory Visit (INDEPENDENT_AMBULATORY_CARE_PROVIDER_SITE_OTHER): Payer: BC Managed Care – PPO

## 2020-07-24 DIAGNOSIS — J309 Allergic rhinitis, unspecified: Secondary | ICD-10-CM | POA: Diagnosis not present

## 2020-08-12 NOTE — Progress Notes (Signed)
Follow Up Note  RE: ARHAN MCMANAMON MRN: [id number] DOB: Jan 16, 1961 Date of Office Visit: 08/13/2020  Referring provider: Gweneth Dimitri, MD Primary care provider: Gweneth Dimitri, MD  Chief Complaint: Asthma  History of Present Illness: I had the pleasure of seeing Cesar Rogerson for a follow up visit at the [facility] on 08/13/2020. He is a 59 y.o. male, who is being followed for asthma, allergic rhinitis on AIT. His previous allergy office visit was on 8/172021 with Dr. Selena Batten. Today is a regular follow up visit.  Moderate persistent asthma Coughing starts when he is outside in the cold. 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  Denies any other symptoms.   Currently on Symbicort 2 puffs twice a day with spacer and rinsing mouth afterwards. Did not start Spiriva yet for this season. He can't remember if it helped last year.   Using albuterol twice a week which helps with the coughing. Does not like to wear a mask when outdoors as it fogs up his glasses then he can't see anything while doing his job.  Perennial and seasonal allergic rhinitis Currently on allergy injections and receiving it every 4 weeks now. Wondering when it's time to stop.  Still on Singulair 10mg  daily, Xyzal in the morning, Flonase few times per week with good benefit. Still has some sneezing and watery eyes if off medications.   Rash: Flat red rash on the left side of his torso for the past 1 month. Not itchy. Only tried lotions with no benefit.  Assessment and Plan: Ervine is a 59 y.o. male with: Moderate persistent asthma without complication Coughing returned when outdoors in the cold. Did not start back on Spiriva yet.  ACT score 20.  Today's spirometry showed some mild restriction.  . Daily controller medication(s): continue Symbicort 67 2 puffs twice a day with spacer and rinse mouth afterwards. . Continue montelukast 10mg  daily.  . ADD on Spiriva 1.43mcg 2 puffs  once a day starting in October through February.  o See if helps with your cough in the winter months.  . May use albuterol rescue inhaler 2 puffs every 4 to 6 hours as needed for shortness of breath, chest tightness, coughing, and wheezing. May use albuterol rescue inhaler 2 puffs 5 to 15 minutes prior to strenuous physical activities. Monitor frequency of use.   Perennial and seasonal allergic rhinitis Past history - 2017 skin testing was positive to grass, tree, weed, ragweed, mold dust mites, cat, dog and cockroach. Started AIT on 03/31/2016 (Mold-DMite-Cat-Dog-CR and 1-Grass-Weed-Tree)  Interim history - doing well with below regimen, still has some symptoms when off medications.   Continue environmental control measures.  Continue allergy injections.  Consider re-testing in 2022 before stopping.   Continue montelukast 10mg  daily.  May use Flonase (fluticasone) nasal spray 1 spray per nostril twice a day as needed for nasal congestion.   Nasal saline spray (i.e., Simply Saline) or nasal saline lavage (i.e., NeilMed) is recommended as needed and prior to medicated nasal sprays.  Rash and other nonspecific skin eruption Non-pruritic erythematous rash x 1 month on the torso.   Moisturize daily - gave handout on proper skin care.   Use OTC hydrocortisone cream twice a day. Samples given.  Return in about 5 weeks (around 09/18/2020).  Meds ordered this encounter  Medications  . Tiotropium Bromide Monohydrate (SPIRIVA RESPIMAT) 1.25 MCG/ACT AERS    Sig: Inhale 2 puffs into the lungs daily.  Dispense:  4 g    Refill:  5   Diagnostics: Spirometry:  Tracings reviewed. His effort: Good reproducible efforts. FVC: 2.69L FEV1: 2.43L, 84% predicted FEV1/FVC ratio: 90% Interpretation: Spirometry consistent with possible restrictive disease.  Please see scanned spirometry results for details.  Medication List:  Current Outpatient Medications  Medication Sig Dispense Refill  .  acetaminophen (TYLENOL) 325 MG tablet Take 650 mg by mouth every 6 (six) hours as needed.    Marland Kitchen albuterol (PROAIR HFA) 108 (90 Base) MCG/ACT inhaler Inhale 2 puffs into the lungs every 4 (four) hours as needed for wheezing or shortness of breath (coughing fits). 2 puffs every 4 hours as needed only  if your can't catch your breath 18 g 1  . ascorbic acid (VITAMIN C) 500 MG tablet 1 tablet    . bisacodyl (DULCOLAX) 5 MG EC tablet Take 1 tablet (5 mg total) by mouth daily as needed for moderate constipation. 14 tablet 0  . budesonide-formoterol (SYMBICORT) 160-4.5 MCG/ACT inhaler Inhale 2 puffs into the lungs 2 (two) times daily. with spacer and rinse mouth afterwards. 10.2 g 5  . Cholecalciferol (VITAMIN D3) 25 MCG (1000 UT) CAPS 1 capsule    . EPINEPHrine 0.3 mg/0.3 mL IJ SOAJ injection See admin instructions.    . fluticasone (FLONASE) 50 MCG/ACT nasal spray Place 2 sprays into both nostrils daily. 16 g 5  . ketoconazole (NIZORAL) 2 % shampoo APPLY TO AFFECTED AREA; LEAVE FOR 3 TO 5 MINUTES BEFORE RINSING    . levocetirizine (XYZAL) 5 MG tablet Take 5 mg by mouth every evening.    . montelukast (SINGULAIR) 10 MG tablet Take 1 tablet (10 mg total) by mouth at bedtime. 30 tablet 5  . Pediatric Multivitamins-Fl (MULTIVITAMINS/FL PO) See admin instructions.    . SF 5000 PLUS 1.1 % CREA dental cream Use to brush teeth beforeYgoing to bed, expectorate, then floss. Do not eat, drink, or rinse for 30 minutes.    . tamsulosin (FLOMAX) 0.4 MG CAPS capsule Take 1 capsule (0.4 mg total) by mouth daily. 10 capsule 0  . Tiotropium Bromide Monohydrate (SPIRIVA RESPIMAT) 1.25 MCG/ACT AERS Inhale 2 puffs into the lungs daily. 4 g 5  . Azelastine HCl 0.15 % SOLN Place 1-2 sprays into both nostrils 2 (two) times daily as needed. (Patient not taking: Reported on 08/13/2020) 30 mL 5   No current facility-administered medications for this visit.   Allergies: Allergies  Allergen Reactions  . Tramadol     "made my  skin crawl"  . Tramadol Hcl Other (See Comments)   I reviewed his past medical history, social history, family history, and environmental history and no significant changes have been reported from his previous visit.  Review of Systems  Constitutional: Negative for appetite change, chills, fever and unexpected weight change.  HENT: Negative for congestion and rhinorrhea.   Eyes: Negative for itching.  Respiratory: Positive for cough. Negative for chest tightness, shortness of breath and wheezing.   Gastrointestinal: Negative for abdominal pain.  Skin: Positive for rash.  Allergic/Immunologic: Positive for environmental allergies.  Neurological: Negative for headaches.   Objective: BP 126/78   Pulse 91   Resp 17   SpO2 97%  There is no height or weight on file to calculate BMI. Physical Exam Vitals and nursing note reviewed.  Constitutional:      Appearance: Normal appearance. He is well-developed.  HENT:     Head: Normocephalic and atraumatic.     Right Ear: External ear normal.  Left Ear: External ear normal.     Nose: Nose normal.     Mouth/Throat:     Mouth: Mucous membranes are moist.     Pharynx: Oropharynx is clear.  Eyes:     Conjunctiva/sclera: Conjunctivae normal.  Cardiovascular:     Rate and Rhythm: Normal rate and regular rhythm.     Heart sounds: Normal heart sounds. No murmur heard.   Pulmonary:     Effort: Pulmonary effort is normal.     Breath sounds: Normal breath sounds. No wheezing, rhonchi or rales.  Musculoskeletal:     Cervical back: Neck supple.  Skin:    General: Skin is warm and dry.     Findings: Rash present.     Comments: Erythematous patches on left midaxillary line with dry areas.   Neurological:     Mental Status: He is alert and oriented to person, place, and time.  Psychiatric:        Behavior: Behavior normal.    Previous notes and tests were reviewed. The plan was reviewed with the patient/family, and all  questions/concerned were addressed.  It was my pleasure to see Roberts today and participate in his care. Please feel free to contact me with any questions or concerns.  Sincerely,  Wyline Mood, DO Allergy & Immunology  [facility] office: [phone] [facility] office: [phone]

## 2020-08-13 ENCOUNTER — Ambulatory Visit: Payer: BC Managed Care – PPO | Admitting: Allergy

## 2020-08-13 ENCOUNTER — Encounter: Payer: Self-pay | Admitting: Allergy

## 2020-08-13 ENCOUNTER — Other Ambulatory Visit: Payer: Self-pay

## 2020-08-13 VITALS — BP 126/78 | HR 91 | Resp 17

## 2020-08-13 DIAGNOSIS — J3089 Other allergic rhinitis: Secondary | ICD-10-CM | POA: Diagnosis not present

## 2020-08-13 DIAGNOSIS — J4540 Moderate persistent asthma, uncomplicated: Secondary | ICD-10-CM | POA: Diagnosis not present

## 2020-08-13 DIAGNOSIS — R21 Rash and other nonspecific skin eruption: Secondary | ICD-10-CM | POA: Diagnosis not present

## 2020-08-13 MED ORDER — SPIRIVA RESPIMAT 1.25 MCG/ACT IN AERS: 2.0000 | INHALATION_SPRAY | Freq: Every day | RESPIRATORY_TRACT | 5 refills | Status: DC

## 2020-08-13 NOTE — Patient Instructions (Addendum)
Moderate persistent asthma  Your breathing test was not as good as the last time. . Daily controller medication(s): continue Symbicort 2 puffs twice a day with spacer and rinse mouth afterwards. . Continue montelukast 10mg  daily.  . ADD on Spiriva 1.18mcg 2 puffs once a day starting in October.  o See if helps with your cough in the winter months.  . May use albuterol rescue inhaler 2 puffs every 4 to 6 hours as needed for shortness of breath, chest tightness, coughing, and wheezing. May use albuterol rescue inhaler 2 puffs 5 to 15 minutes prior to strenuous physical activities. Monitor frequency of use.  . Asthma control goals:  o Full participation in all desired activities (may need albuterol before activity) o Albuterol use two times or less a week on average (not counting use with activity) o Cough interfering with sleep two times or less a month o Oral steroids no more than once a year o No hospitalizations  Perennial and seasonal allergic rhinitis  2017 skin testing was positive to grass, tree, weed, ragweed, mold dust mites, cat, dog and cockroach.  Continue environmental control measures.  Continue allergy injections.  Continue montelukast 10mg  daily.  May use Flonase (fluticasone) nasal spray 1 spray per nostril twice a day as needed for nasal congestion.   Nasal saline spray (i.e., Simply Saline) or nasal saline lavage (i.e., NeilMed) is recommended as needed and prior to medicated nasal sprays.  Rash:  Moisturize daily.  Use hydrocortisone cream twice a day and if no improvement let Dr. 2018 know at the next visit.  Follow up in 5 weeks or sooner if needed in [place] with Dr. .  Skin care recommendations  Bath time: . Always use lukewarm water. AVOID very hot or cold water. Dellis Anes Keep bathing time to 5-10 minutes. . Do NOT use bubble bath. . Use a mild soap and use just enough to wash the dirty areas. . Do NOT scrub skin vigorously.   . After bathing, pat dry your skin with a towel. Do NOT rub or scrub the skin.  Moisturizers and prescriptions:  . ALWAYS apply moisturizers immediately after bathing (within 3 minutes). This helps to lock-in moisture. . Use the moisturizer several times a day over the whole body. Dellis Anes summer moisturizers include: Aveeno, CeraVe, Cetaphil. Marland Kitchen winter moisturizers include: Aquaphor, Vaseline, Cerave, Cetaphil, Eucerin, Vanicream. . When using moisturizers along with medications, the moisturizer should be applied about one hour after applying the medication to prevent diluting effect of the medication or moisturize around where you applied the medications. When not using medications, the moisturizer can be continued twice daily as maintenance.  Laundry and clothing: . Avoid laundry products with added color or perfumes. . Use unscented hypo-allergenic laundry products such as Tide free, Cheer free & gentle, and All free and clear.  . If the skin still seems dry or sensitive, you can try double-rinsing the clothes. . Avoid tight or scratchy clothing such as wool. . Do not use fabric softeners or dyer sheets.

## 2020-08-13 NOTE — Assessment & Plan Note (Signed)
Non-pruritic erythematous rash x 1 month on the torso.   Moisturize daily - gave handout on proper skin care.   Use OTC hydrocortisone cream twice a day. Samples given.

## 2020-08-13 NOTE — Assessment & Plan Note (Signed)
Past history - 2017 skin testing was positive to grass, tree, weed, ragweed, mold dust mites, cat, dog and cockroach. Started AIT on 03/31/2016 (Mold-DMite-Cat-Dog-CR and 1-Grass-Weed-Tree)  Interim history - doing well with below regimen, still has some symptoms when off medications.   Continue environmental control measures.  Continue allergy injections.  Consider re-testing in 2022 before stopping.   Continue montelukast 10mg  daily.  May use Flonase (fluticasone) nasal spray 1 spray per nostril twice a day as needed for nasal congestion.   Nasal saline spray (i.e., Simply Saline) or nasal saline lavage (i.e., NeilMed) is recommended as needed and prior to medicated nasal sprays.

## 2020-08-13 NOTE — Assessment & Plan Note (Signed)
Coughing returned when outdoors in the cold. Did not start back on Spiriva yet.  ACT score 20.  Today's spirometry showed some mild restriction.  . Daily controller medication(s): continue Symbicort 2 puffs twice a day with spacer and rinse mouth afterwards. . Continue montelukast 10mg  daily.  . ADD on Spiriva 1.70mcg 2 puffs once a day starting in October through February.  o See if helps with your cough in the winter months.  . May use albuterol rescue inhaler 2 puffs every 4 to 6 hours as needed for shortness of breath, chest tightness, coughing, and wheezing. May use albuterol rescue inhaler 2 puffs 5 to 15 minutes prior to strenuous physical activities. Monitor frequency of use.

## 2020-08-21 ENCOUNTER — Ambulatory Visit (INDEPENDENT_AMBULATORY_CARE_PROVIDER_SITE_OTHER): Payer: BC Managed Care – PPO

## 2020-08-21 DIAGNOSIS — J309 Allergic rhinitis, unspecified: Secondary | ICD-10-CM

## 2020-09-18 ENCOUNTER — Encounter: Payer: Self-pay | Admitting: Allergy & Immunology

## 2020-09-18 ENCOUNTER — Other Ambulatory Visit: Payer: Self-pay

## 2020-09-18 ENCOUNTER — Ambulatory Visit: Payer: BC Managed Care – PPO | Admitting: Allergy & Immunology

## 2020-09-18 VITALS — BP 112/82 | HR 93 | Temp 98.2°F | Wt 194.8 lb

## 2020-09-18 DIAGNOSIS — J4540 Moderate persistent asthma, uncomplicated: Secondary | ICD-10-CM

## 2020-09-18 DIAGNOSIS — J302 Other seasonal allergic rhinitis: Secondary | ICD-10-CM

## 2020-09-18 DIAGNOSIS — J3089 Other allergic rhinitis: Secondary | ICD-10-CM

## 2020-09-18 DIAGNOSIS — J309 Allergic rhinitis, unspecified: Secondary | ICD-10-CM

## 2020-09-18 NOTE — Patient Instructions (Addendum)
1. Seasonal and perennial allergic rhinitis - Continue with allergy shots at the same schedule. - Continue with Flonase one spray per nostril 1-2 times daily. - Continue with Singulair daily.   2. Moderate persistent asthma without complication - Lung testing not done today. - Continue with Spiriva and the Symbicort. - Sample of Breztri provided to use once you are finished with the Spiriva.  - Continue with albuterol as needed.   3. Return in about 6 months (around 03/18/2021).    Please inform us of any Emergency Department visits, hospitalizations, or changes in symptoms. Call us before going to the ED for breathing or allergy symptoms since we might be able to fit you in for a sick visit. Feel free to contact us anytime with any questions, problems, or concerns.  It was a pleasure to meet you today!  Websites that have reliable patient information: 1. American Academy of Asthma, Allergy, and Immunology: www.aaaai.org 2. Food Allergy Research and Education (FARE): foodallergy.org 3. Mothers of Asthmatics: http://www.asthmacommunitynetwork.org 4. American College of Allergy, Asthma, and Immunology: www.acaai.org   COVID-19 Vaccine Information can be found at: PodExchange.[email or web address] For questions related to vaccine distribution or appointments, please email [email or web address] or call [phone].     "Like" [place] on Facebook and Instagram for our latest updates!       Make sure you are registered to vote! If you have moved or changed any of your contact information, you will need to get this updated before voting!  In some cases, you MAY be able to register to vote online: [email or web address]

## 2020-09-18 NOTE — Progress Notes (Signed)
FOLLOW UP  Date of Service/Encounter:  09/18/20   Assessment:   Moderate persistent asthma without complication  Seasonal and perennial allergic rhinitis - on allergen immunotherapy  GERD - on famotidine   Mr. Antonio Holmes is doing fairly well today, although he continues to cough. He tells me that this is better than he was when he saw Dr. Selena Holmes late last year. This cough has been going on for 12 years and overall this is the best that it has been. I am not going to worry too much about this at this point in time, but we can revisit it at the next visit as I get to know him better. We are going to continue with the Symbicort and the Spiriva. I did provide him with Breztri samples to see if this would be more effective, if not cheaper and easier to use than his current regimen. He is going to finish the Spiriva and Symbicort that he has before trying this new inhaler.   Plan/Recommendations:   1. Seasonal and perennial allergic rhinitis - Continue with allergy shots at the same schedule. - Continue with Flonase one spray per nostril 1-2 times daily. - Continue with Singulair daily.   2. Moderate persistent asthma without complication - Lung testing not done today. - Continue with Spiriva and the Symbicort. - Sample of Breztri provided to use once you are finished with the Spiriva.  - Continue with albuterol as needed.   3. Return in about 6 months (around 03/18/2021).   Subjective:   Antonio Holmes is a 60 y.o. male presenting today for follow up of No chief complaint on file.   Antonio Holmes has a history of the following: Patient Active Problem List   Diagnosis Date Noted  . Rash and other nonspecific skin eruption 08/13/2020  . Left knee pain 12/05/2019  . Allergic conjunctivitis 02/28/2019  . Asthma with acute exacerbation 06/22/2016  . Perennial and seasonal allergic rhinitis 02/17/2016  . History of esophageal reflux 02/17/2016  . Cough, persistent 02/17/2016  .  Moderate persistent asthma without complication 09/20/2013    History obtained from: chart review and patient.  Antonio Holmes is a 60 y.o. male presenting for a follow up visit.  He was last seen in December 2021 by Dr. Selena Holmes.  At that time, I added on Spiriva 2 puffs once daily in combination with the Symbicort.  The Spiriva was only added for October through February which was his worst time of the year for his breathing.  For his allergic rhinitis, he was continued on his allergy shots as well as Flonase and montelukast.  He lives in [place] and comes to [place] to get his shots, so he deicded just to come here instead.   Asthma/Respiratory Symptom History: He remains on the Spiriva two puffs once daily. He is also on the Symbicort. The cough has improved. He is coughing every single day. It is a dry cough. He never has a fever with it. It resolves in the summer time completely. Spiriva is $47 per month. The Symbicort is $47 per month as well. 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 He stays hoarse all winter and then it clears up in the spring, summer, and fall. He remains on famotidine for his GERD.   Allergic Rhinitis Symptom History: He remains on the allergy shots. He has no problesm with the ibnjections and has not had a large  local reaction in quite some time.  He has not needed antibiotics at all since the last visit. He uses his fluticasone daily and his montelukast daily.   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   Otherwise, there have been no changes to his past medical history, surgical history, family history, or social history.    Review of Systems  Constitutional: Negative.  Negative for fever, malaise/fatigue and weight loss.  HENT: Negative.  Negative for congestion, ear discharge, ear pain and sore throat.   Eyes: Negative  for pain, discharge and redness.  Respiratory: Positive for cough. Negative for sputum production, shortness of breath and wheezing.   Cardiovascular: Negative.  Negative for chest pain and palpitations.  Gastrointestinal: Negative for abdominal pain, diarrhea, heartburn, nausea and vomiting.  Skin: Negative.  Negative for itching and rash.  Neurological: Negative for dizziness and headaches.  Endo/Heme/Allergies: Negative for environmental allergies. Does not bruise/bleed easily.       Objective:   Blood pressure 112/82, pulse 93, temperature 98.2 F (36.8 C), temperature source Temporal, weight 194 lb 12.8 oz (88.4 kg), SpO2 96 %. Body mass index is 33.44 kg/m.   Physical Exam:  Physical Exam Constitutional:      Appearance: He is well-developed.     Comments: Very pleasant.   HENT:     Head: Normocephalic and atraumatic.     Right Ear: Tympanic membrane, ear canal and external ear normal.     Left Ear: Tympanic membrane and ear canal normal.     Nose: No nasal deformity, septal deviation, mucosal edema, rhinorrhea or epistaxis.     Right Sinus: No maxillary sinus tenderness or frontal sinus tenderness.     Left Sinus: No maxillary sinus tenderness or frontal sinus tenderness.     Mouth/Throat:     Mouth: Oropharynx is clear and moist. Mucous membranes are not pale and not dry.     Pharynx: Uvula midline.     Comments: Hoarse.  Eyes:     General:        Right eye: No discharge.        Left eye: No discharge.     Extraocular Movements: EOM normal.     Conjunctiva/sclera: Conjunctivae normal.     Right eye: Right conjunctiva is not injected. No chemosis.    Left eye: Left conjunctiva is not injected. No chemosis.    Pupils: Pupils are equal, round, and reactive to light.  Cardiovascular:     Rate and Rhythm: Normal rate and regular rhythm.     Heart sounds: Normal heart sounds.  Pulmonary:     Effort: Pulmonary effort is normal. No tachypnea, accessory muscle usage or  respiratory distress.     Breath sounds: Normal breath sounds. No wheezing, rhonchi or rales.  Chest:     Chest wall: No tenderness.  Lymphadenopathy:     Cervical: No cervical adenopathy.  Skin:    General: Skin is warm.     Capillary Refill: Capillary refill takes less than 2 seconds.     Coloration: Skin is not pale.     Findings: No abrasion, erythema, petechiae or rash. Rash is not papular, urticarial or vesicular.     Comments: No eczematous or urticarial lesions noted.   Neurological:     Mental Status: He is alert.  Psychiatric:        Mood and Affect: Mood and affect normal.      Diagnostic  studies: none    Malachi Bonds, MD  [facility]

## 2020-09-25 DIAGNOSIS — J301 Allergic rhinitis due to pollen: Secondary | ICD-10-CM | POA: Diagnosis not present

## 2020-09-26 NOTE — Progress Notes (Signed)
Vials exp 09-26-21 

## 2020-10-16 ENCOUNTER — Ambulatory Visit (INDEPENDENT_AMBULATORY_CARE_PROVIDER_SITE_OTHER): Payer: BC Managed Care – PPO

## 2020-10-16 DIAGNOSIS — J309 Allergic rhinitis, unspecified: Secondary | ICD-10-CM | POA: Diagnosis not present

## 2020-11-13 ENCOUNTER — Ambulatory Visit (INDEPENDENT_AMBULATORY_CARE_PROVIDER_SITE_OTHER): Payer: BC Managed Care – PPO

## 2020-11-13 DIAGNOSIS — J309 Allergic rhinitis, unspecified: Secondary | ICD-10-CM

## 2020-12-11 ENCOUNTER — Ambulatory Visit (INDEPENDENT_AMBULATORY_CARE_PROVIDER_SITE_OTHER): Payer: BC Managed Care – PPO | Admitting: *Deleted

## 2020-12-11 DIAGNOSIS — J309 Allergic rhinitis, unspecified: Secondary | ICD-10-CM

## 2020-12-18 ENCOUNTER — Ambulatory Visit (INDEPENDENT_AMBULATORY_CARE_PROVIDER_SITE_OTHER): Payer: BC Managed Care – PPO

## 2020-12-18 DIAGNOSIS — J309 Allergic rhinitis, unspecified: Secondary | ICD-10-CM

## 2020-12-25 ENCOUNTER — Ambulatory Visit (INDEPENDENT_AMBULATORY_CARE_PROVIDER_SITE_OTHER): Payer: BC Managed Care – PPO

## 2020-12-25 DIAGNOSIS — J309 Allergic rhinitis, unspecified: Secondary | ICD-10-CM

## 2021-01-01 ENCOUNTER — Ambulatory Visit (INDEPENDENT_AMBULATORY_CARE_PROVIDER_SITE_OTHER): Payer: BC Managed Care – PPO

## 2021-01-01 DIAGNOSIS — J309 Allergic rhinitis, unspecified: Secondary | ICD-10-CM

## 2021-01-06 ENCOUNTER — Other Ambulatory Visit: Payer: Self-pay | Admitting: Allergy

## 2021-01-08 ENCOUNTER — Ambulatory Visit (INDEPENDENT_AMBULATORY_CARE_PROVIDER_SITE_OTHER): Payer: BC Managed Care – PPO

## 2021-01-08 DIAGNOSIS — J309 Allergic rhinitis, unspecified: Secondary | ICD-10-CM | POA: Diagnosis not present

## 2021-02-05 ENCOUNTER — Ambulatory Visit (INDEPENDENT_AMBULATORY_CARE_PROVIDER_SITE_OTHER): Payer: BC Managed Care – PPO

## 2021-02-05 DIAGNOSIS — J309 Allergic rhinitis, unspecified: Secondary | ICD-10-CM

## 2021-03-05 ENCOUNTER — Other Ambulatory Visit: Payer: Self-pay

## 2021-03-05 ENCOUNTER — Ambulatory Visit: Payer: BC Managed Care – PPO | Admitting: Allergy & Immunology

## 2021-03-05 ENCOUNTER — Encounter: Payer: Self-pay | Admitting: Allergy & Immunology

## 2021-03-05 ENCOUNTER — Ambulatory Visit: Payer: Self-pay

## 2021-03-05 VITALS — BP 112/64 | HR 79 | Temp 97.9°F | Wt 194.9 lb

## 2021-03-05 DIAGNOSIS — J309 Allergic rhinitis, unspecified: Secondary | ICD-10-CM | POA: Diagnosis not present

## 2021-03-05 DIAGNOSIS — R21 Rash and other nonspecific skin eruption: Secondary | ICD-10-CM

## 2021-03-05 DIAGNOSIS — J4540 Moderate persistent asthma, uncomplicated: Secondary | ICD-10-CM

## 2021-03-05 DIAGNOSIS — J3089 Other allergic rhinitis: Secondary | ICD-10-CM

## 2021-03-05 MED ORDER — ALBUTEROL SULFATE HFA 108 (90 BASE) MCG/ACT IN AERS: 2.0000 | INHALATION_SPRAY | RESPIRATORY_TRACT | 1 refills | Status: DC | PRN

## 2021-03-05 MED ORDER — OMEPRAZOLE 40 MG PO CPDR: 40.0000 mg | DELAYED_RELEASE_CAPSULE | Freq: Every day | ORAL | 3 refills | Status: DC

## 2021-03-05 MED ORDER — EPINEPHRINE 0.3 MG/0.3ML IJ SOAJ: 0.3000 mg | INTRAMUSCULAR | 1 refills | Status: DC

## 2021-03-05 NOTE — Progress Notes (Signed)
FOLLOW UP  Date of Service/Encounter:  03/06/21   Assessment:   Moderate persistent asthma without complication   Seasonal and perennial allergic rhinitis - on allergen immunotherapy   GERD - on famotidine  Plan/Recommendations:   1. Seasonal and perennial allergic rhinitis - Continue with allergy shots at the same schedule. - Continue with Flonase one spray per nostril 1-2 times daily. - Continue with Singulair daily.   2. Moderate persistent asthma without complication - Lung testing looks fairly good today. - I think a lot of the cough might be related to uncontrolled reflux. - We are going to stop the famotidine and start omeprazole 40mg  30 minutes before the first meal of the day.  - Stop the Spiriva and the Symbicort and START Breztri two puffs twice daily (contains the three medications to help with asthma). - Daily controller medication(s): Breztri two puffs twice daily with a spacer - Prior to physical activity: albuterol 2 puffs 10-15 minutes before physical activity. - Rescue medications: albuterol 4 puffs every 4-6 hours as needed - Asthma control goals:  * Full participation in all desired activities (may need albuterol before activity) * Albuterol use two time or less a week on average (not counting use with activity) * Cough interfering with sleep two time or less a month * Oral steroids no more than once a year * No hospitalizations  3. Return in about 3 months (around 06/05/2021).   Subjective:   Antonio Holmes is a 60 y.o. male presenting today for follow up of  Chief Complaint  Patient presents with   Follow-up   Cough    Antonio Holmes has a history of the following: Patient Active Problem List   Diagnosis Date Noted   Rash and other nonspecific skin eruption 08/13/2020   Left knee pain 12/05/2019   Allergic conjunctivitis 02/28/2019   Asthma with acute exacerbation 06/22/2016   Perennial and seasonal allergic rhinitis 02/17/2016    History of esophageal reflux 02/17/2016   Cough, persistent 02/17/2016   Moderate persistent asthma without complication 09/20/2013    History obtained from: chart review and patient.  Antonio Holmes is a 60 y.o. male presenting for a follow up visit.  He was last seen in January 2022.  At that time, he was doing fairly well, but continued to cough.  We did not do lung testing.  We continued with his Spiriva and Symbicort.  We did give him a sample of Breztri to use when he was finished with the Spiriva.  We continue with albuterol as needed.  For his allergy shots, he was doing well.  We continue with Flonase as well as Singulair.  Since last visit, he has continued to have a cough.  Asthma/Respiratory Symptom History: He reports coughing 2-3 times during the day. This is mostly daytime and he never has this at night. He does have some throat clearing and hoarseness.  He tried the [place] but he could not tell the difference. He went back to the Spiriva and the Symbicort. The copay for this is $47 per month for each one.  GERD Symptom History: He is using famotidine and stopped it recently stopped taking it this past week. He is trying to get off some medications.  He was never on any proton pump inhibitor from what I can tell from talking with him.  He appears asymptomatic at night.  The coughing is only during the day.  He does feel like his postnasal drip is controlled.  He  is not sure what is causing the cough.  He is not on an ACE inhibitor.  Allergic Rhinitis Symptom History: He remains on the Singulair.  He also has a nose spray which he does not use on a routine basis.  Occasionally he will use a antihistamine.  Allergy shots are going well.  He is not having any breakthrough symptoms.  Otherwise, there have been no changes to his past medical history, surgical history, family history, or social history.    Review of Systems  Constitutional: Negative.  Negative for fever, malaise/fatigue and  weight loss.  HENT:  Positive for congestion. Negative for ear discharge and ear pain.   Eyes:  Negative for pain, discharge and redness.  Respiratory:  Positive for cough. Negative for sputum production, shortness of breath and wheezing.   Cardiovascular: Negative.  Negative for chest pain and palpitations.  Gastrointestinal:  Positive for heartburn. Negative for abdominal pain, nausea and vomiting.  Skin: Negative.  Negative for itching and rash.  Neurological:  Negative for dizziness and headaches.  Endo/Heme/Allergies:  Negative for environmental allergies. Does not bruise/bleed easily.      Objective:   Blood pressure 112/64, pulse 79, temperature 97.9 F (36.6 C), weight 194 lb 14.2 oz (88.4 kg), SpO2 98 %. Body mass index is 33.45 kg/m.   Physical Exam:  Physical Exam Vitals reviewed.  Constitutional:      Appearance: He is well-developed.     Comments: Hoarse voice.  HENT:     Head: Normocephalic and atraumatic.     Right Ear: Tympanic membrane, ear canal and external ear normal.     Left Ear: Tympanic membrane, ear canal and external ear normal.     Nose: No nasal deformity, septal deviation, mucosal edema or rhinorrhea.     Right Turbinates: Enlarged and swollen.     Left Turbinates: Enlarged and swollen.     Right Sinus: No maxillary sinus tenderness or frontal sinus tenderness.     Left Sinus: No maxillary sinus tenderness or frontal sinus tenderness.     Comments: No nasal polyps noted.    Mouth/Throat:     Mouth: Mucous membranes are not pale and not dry.     Pharynx: Uvula midline.  Eyes:     General: Lids are normal. No allergic shiner.       Right eye: No discharge.        Left eye: No discharge.     Conjunctiva/sclera: Conjunctivae normal.     Right eye: Right conjunctiva is not injected. No chemosis.    Left eye: Left conjunctiva is not injected. No chemosis.    Pupils: Pupils are equal, round, and reactive to light.  Cardiovascular:     Rate and  Rhythm: Normal rate and regular rhythm.     Heart sounds: Normal heart sounds.  Pulmonary:     Effort: Pulmonary effort is normal. No tachypnea, accessory muscle usage or respiratory distress.     Breath sounds: Normal breath sounds. No wheezing, rhonchi or rales.     Comments: Moving air well in all lung fields.  No increased work of breathing. Chest:     Chest wall: No tenderness.  Lymphadenopathy:     Cervical: No cervical adenopathy.  Skin:    General: Skin is warm.     Capillary Refill: Capillary refill takes less than 2 seconds.     Coloration: Skin is not pale.     Findings: No abrasion, erythema, petechiae or rash. Rash is not papular,  urticarial or vesicular.     Comments: No eczematous or urticarial lesions noted.  Neurological:     Mental Status: He is alert.  Psychiatric:        Behavior: Behavior is cooperative.     Diagnostic studies:   Spirometry: results normal (FEV1: 2.38/82%, FVC: 2.71/73%, FEV1/FVC: 88%).    Spirometry consistent with normal pattern.   Allergy Studies: none        Malachi Bonds, MD  [facility]

## 2021-03-05 NOTE — Patient Instructions (Addendum)
1. Seasonal and perennial allergic rhinitis - Continue with allergy shots at the same schedule. - Continue with Flonase one spray per nostril 1-2 times daily. - Continue with Singulair daily.   2. Moderate persistent asthma without complication - Lung testing looks fairly good today. - I think a lot of the cough might be related to uncontrolled reflux. - We are going to stop the famotidine and start omeprazole 40mg  30 minutes before the first meal of the day.  - Stop the Spiriva and the Symbicort and START Breztri two puffs twice daily (contains the three medications to help with asthma). - Daily controller medication(s): Breztri two puffs twice daily with a spacer - Prior to physical activity: albuterol 2 puffs 10-15 minutes before physical activity. - Rescue medications: albuterol 4 puffs every 4-6 hours as needed - Asthma control goals:  * Full participation in all desired activities (may need albuterol before activity) * Albuterol use two time or less a week on average (not counting use with activity) * Cough interfering with sleep two time or less a month * Oral steroids no more than once a year * No hospitalizations  3. Return in about 3 months (around 06/05/2021).    Please inform 06/07/2021 of any Emergency Department visits, hospitalizations, or changes in symptoms. Call us before going to the ED for breathing or allergy symptoms since we might be able to fit you in for a sick visit. Feel free to contact us anytime with any questions, problems, or concerns.  It was a pleasure to see you again today!  Websites that have reliable patient information: 1. American Academy of Asthma, Allergy, and Immunology: www.aaaai.org 2. Food Allergy Research and Education (FARE): foodallergy.org 3. Mothers of Asthmatics: http://www.asthmacommunitynetwork.org 4. American College of Allergy, Asthma, and Immunology: www.acaai.org   COVID-19 Vaccine Information can be found at:  [place] For questions related to vaccine distribution or appointments, please email [email or web address] or call [phone].     "Like" [phone] on Facebook and Instagram for our latest updates!       Make sure you are registered to vote! If you have moved or changed any of your contact information, you will need to get this updated before voting!  In some cases, you MAY be able to register to vote online: [place]

## 2021-03-06 ENCOUNTER — Encounter: Payer: Self-pay | Admitting: Allergy & Immunology

## 2021-03-12 ENCOUNTER — Telehealth: Payer: Self-pay

## 2021-03-12 ENCOUNTER — Other Ambulatory Visit: Payer: Self-pay | Admitting: *Deleted

## 2021-03-12 MED ORDER — BREZTRI AEROSPHERE 160-9-4.8 MCG/ACT IN AERO
2.0000 | INHALATION_SPRAY | Freq: Two times a day (BID) | RESPIRATORY_TRACT | 1 refills | Status: DC
Start: 2021-03-12 — End: 2021-12-10

## 2021-03-12 NOTE — Telephone Encounter (Signed)
Patient called back and I informed him that the medication had been sent in and told him if he needed anything else, to let us know. He said thank you.

## 2021-03-12 NOTE — Telephone Encounter (Signed)
Medication has been sent in. Called and left a voicemail asking for patient to return call to inform.

## 2021-03-12 NOTE — Telephone Encounter (Signed)
Patient called stating the pharmacy did not receive his new inhaler Breztri at the time of his visit on 03/05/2021. Patient states all the other medications sent in were correct.   [facility]

## 2021-03-19 ENCOUNTER — Ambulatory Visit: Payer: BC Managed Care – PPO | Admitting: Allergy & Immunology

## 2021-04-02 ENCOUNTER — Ambulatory Visit (INDEPENDENT_AMBULATORY_CARE_PROVIDER_SITE_OTHER): Payer: BC Managed Care – PPO | Admitting: *Deleted

## 2021-04-02 DIAGNOSIS — J309 Allergic rhinitis, unspecified: Secondary | ICD-10-CM

## 2021-04-30 ENCOUNTER — Ambulatory Visit (INDEPENDENT_AMBULATORY_CARE_PROVIDER_SITE_OTHER): Payer: BC Managed Care – PPO

## 2021-04-30 DIAGNOSIS — J309 Allergic rhinitis, unspecified: Secondary | ICD-10-CM

## 2021-05-28 ENCOUNTER — Ambulatory Visit (INDEPENDENT_AMBULATORY_CARE_PROVIDER_SITE_OTHER): Payer: BC Managed Care – PPO

## 2021-05-28 DIAGNOSIS — J309 Allergic rhinitis, unspecified: Secondary | ICD-10-CM | POA: Diagnosis not present

## 2021-05-29 DIAGNOSIS — J3089 Other allergic rhinitis: Secondary | ICD-10-CM | POA: Diagnosis not present

## 2021-05-29 NOTE — Progress Notes (Signed)
VIALS MADE. EXP 05-29-22 

## 2021-06-04 ENCOUNTER — Ambulatory Visit: Payer: BC Managed Care – PPO | Admitting: Allergy & Immunology

## 2021-06-04 ENCOUNTER — Other Ambulatory Visit: Payer: Self-pay

## 2021-06-04 ENCOUNTER — Encounter: Payer: Self-pay | Admitting: Allergy & Immunology

## 2021-06-04 VITALS — BP 140/68 | HR 96 | Temp 98.1°F | Resp 18 | Ht 64.0 in | Wt 190.2 lb

## 2021-06-04 DIAGNOSIS — J3089 Other allergic rhinitis: Secondary | ICD-10-CM | POA: Diagnosis not present

## 2021-06-04 DIAGNOSIS — J4540 Moderate persistent asthma, uncomplicated: Secondary | ICD-10-CM

## 2021-06-04 DIAGNOSIS — J302 Other seasonal allergic rhinitis: Secondary | ICD-10-CM

## 2021-06-04 MED ORDER — ALBUTEROL SULFATE HFA 108 (90 BASE) MCG/ACT IN AERS: INHALATION_SPRAY | RESPIRATORY_TRACT | 1 refills | Status: DC

## 2021-06-04 MED ORDER — MONTELUKAST SODIUM 10 MG PO TABS: ORAL_TABLET | ORAL | 1 refills | Status: DC

## 2021-06-04 NOTE — Patient Instructions (Addendum)
1. Seasonal and perennial allergic rhinitis - Continue with allergy shots at the same schedule. - Continue with Flonase one spray per nostril 1-2 times daily. - Continue with Singulair daily.   2. Moderate persistent asthma without complication - Lung testing looks fairly good today. - Let's go head and keep the inhalers off since you have been stable for so long. - The allergy shots and the reflux medications might be helping to control the coughing.  - Call us if your symptoms change.  - Daily controller medication(s): NOTHING - Prior to physical activity: albuterol 2 puffs 10-15 minutes before physical activity. - Rescue medications: albuterol 4 puffs every 4-6 hours as needed - Asthma control goals:  * Full participation in all desired activities (may need albuterol before activity) * Albuterol use two time or less a week on average (not counting use with activity) * Cough interfering with sleep two time or less a month * Oral steroids no more than once a year * No hospitalizations  3. Return in about 6 months (around 12/03/2021).    Please inform us of any Emergency Department visits, hospitalizations, or changes in symptoms. Call us before going to the ED for breathing or allergy symptoms since we might be able to fit you in for a sick visit. Feel free to contact us anytime with any questions, problems, or concerns.  It was a pleasure to see you again today!  Websites that have reliable patient information: 1. American Academy of Asthma, Allergy, and Immunology: www.aaaai.org 2. Food Allergy Research and Education (FARE): foodallergy.org 3. Mothers of Asthmatics: http://www.asthmacommunitynetwork.org 4. American College of Allergy, Asthma, and Immunology: www.acaai.org   COVID-19 Vaccine Information can be found at: [email or web address] For questions related to vaccine distribution or appointments, please email  [email or web address] or call [phone].     "Like" [place] on Facebook and Instagram for our latest updates!       Make sure you are registered to vote! If you have moved or changed any of your contact information, you will need to get this updated before voting!  In some cases, you MAY be able to register to vote online: [email or web address]

## 2021-06-04 NOTE — Progress Notes (Signed)
FOLLOW UP  Date of Service/Encounter:  06/04/21   Assessment:   Moderate persistent asthma without complication - now asymptomatic without medications likely secondary to allergic rhinitis control and GERD control   Seasonal and perennial allergic rhinitis - on allergen immunotherapy (maintenance reached April 2018)   GERD - on PPI + H2 blocker with good control   Plan/Recommendations:   1. Seasonal and perennial allergic rhinitis - Continue with allergy shots at the same schedule. - Continue with Flonase one spray per nostril 1-2 times daily. - Continue with Singulair daily.   2. Moderate persistent asthma without complication - Lung testing looks fairly good today. - Let's go head and keep the inhalers off since you have been stable for so long. - The allergy shots and the reflux medications might be helping to control the coughing.  - Call us if your symptoms change.  - Daily controller medication(s): NOTHING - Prior to physical activity: albuterol 2 puffs 10-15 minutes before physical activity. - Rescue medications: albuterol 4 puffs every 4-6 hours as needed - Asthma control goals:  * Full participation in all desired activities (may need albuterol before activity) * Albuterol use two time or less a week on average (not counting use with activity) * Cough interfering with sleep two time or less a month * Oral steroids no more than once a year * No hospitalizations  3. Return in about 6 months (around 12/03/2021).    Subjective:   Antonio Holmes is a 60 y.o. male presenting today for follow up of  Chief Complaint  Patient presents with   Asthma    3 mth f/u    Antonio Holmes has a history of the following: Patient Active Problem List   Diagnosis Date Noted   Rash and other nonspecific skin eruption 08/13/2020   Left knee pain 12/05/2019   Allergic conjunctivitis 02/28/2019   Asthma with acute exacerbation 06/22/2016   Perennial and seasonal allergic  rhinitis 02/17/2016   History of esophageal reflux 02/17/2016   Cough, persistent 02/17/2016   Moderate persistent asthma without complication 09/20/2013    History obtained from: chart review and patient.  Antonio Holmes is a 60 y.o. male presenting for a follow up visit.  We last saw him in July 2022.  At that time, we continued with allergy shots as well as Singulair.  His asthma was under good control but we changed from Symbicort and Spiriva to Breztri 2 puffs twice daily.  We stopped his famotidine and started omeprazole instead.  He got some unfortunate news today. He had a bridge in his mouth that fell out. There was a concern that inhalers were causing problems. He has used the spacer and has brushed teeth and rinsed his mouth every time. He thinks that this might just be bad genetics.   Asthma/Respiratory Symptom History: He remains on the Breztri two puffs twice daily. He thinks that this is working well to control his symptoms. He did stop taking it one month ago. He is not using it at all. He cannot tell any difference whether he is taking it or not.   Review of his asthma history shows that he was continued on Symbicort back in June 2017 with Dr. Nunzio Cobbs. He never really felt better with the addition of the Symbicort. He does have some SOB with intense physical activity. It seems from that note that he was on Symbicort at the time for two years. He was started on that by Dr. Sherene Sires before  establishing care with our clinic.   Allergic Rhinitis Symptom History: He remains on allergy shots.  He is also using Singulair 10mg  daily. He feels that his symptoms are under good control with the allergy shots. He has had a good year when it comes to his allergic rhinitis symptoms.   Antonio Holmes is on allergen immunotherapy. He receives two injections. Immunotherapy script #1 contains trees, weeds, and grasses. He currently receives 0.23mL of the RED vial (1/100). Immunotherapy script #2 contains molds, dust  mites, cat, dog, and cockroach. He currently receives 0.17mL of the RED vial (1/100). He started shots August of 2017 and reached maintenance in April of 2018.  GERD Symptom History: He reports that the coughing has improved since he was started on the reflux medication.   Otherwise, there have been no changes to his past medical history, surgical history, family history, or social history.    Review of Systems  Constitutional: Negative.  Negative for chills, fever, malaise/fatigue and weight loss.  HENT:  Negative for congestion, ear discharge, ear pain and sinus pain.   Eyes:  Negative for pain, discharge and redness.  Respiratory:  Negative for cough, sputum production, shortness of breath and wheezing.   Cardiovascular: Negative.  Negative for chest pain and palpitations.  Gastrointestinal:  Negative for abdominal pain, constipation, diarrhea, heartburn, nausea and vomiting.  Skin: Negative.  Negative for itching and rash.  Neurological:  Negative for dizziness and headaches.  Endo/Heme/Allergies:  Positive for environmental allergies. Does not bruise/bleed easily.      Objective:   Blood pressure 140/68, pulse 96, temperature 98.1 F (36.7 C), resp. rate 18, height 5\' 4"  (1.626 m), weight 190 lb 3.2 oz (86.3 kg), SpO2 94 %. Body mass index is 32.65 kg/m.   Physical Exam:  Physical Exam Vitals reviewed.  Constitutional:      Appearance: He is well-developed.     Comments: Pleasant and smiling.   HENT:     Head: Normocephalic and atraumatic.     Right Ear: Tympanic membrane, ear canal and external ear normal.     Left Ear: Tympanic membrane, ear canal and external ear normal.     Nose: Mucosal edema and rhinorrhea present. No nasal deformity or septal deviation.     Right Turbinates: Enlarged, swollen and pale.     Left Turbinates: Enlarged, swollen and pale.     Right Sinus: No maxillary sinus tenderness or frontal sinus tenderness.     Left Sinus: No maxillary sinus  tenderness or frontal sinus tenderness.     Mouth/Throat:     Mouth: Mucous membranes are not pale and not dry.     Pharynx: Uvula midline.  Eyes:     General: Lids are normal. No allergic shiner.       Right eye: No discharge.        Left eye: No discharge.     Conjunctiva/sclera: Conjunctivae normal.     Right eye: Right conjunctiva is not injected. No chemosis.    Left eye: Left conjunctiva is not injected. No chemosis.    Pupils: Pupils are equal, round, and reactive to light.  Cardiovascular:     Rate and Rhythm: Normal rate and regular rhythm.     Heart sounds: Normal heart sounds.  Pulmonary:     Effort: Pulmonary effort is normal. No tachypnea, accessory muscle usage, respiratory distress or retractions.     Breath sounds: Normal breath sounds. No wheezing, rhonchi or rales.     Comments: Moving air well  in all lung fields. No increased work of breathing noted.  Chest:     Chest wall: No tenderness.  Lymphadenopathy:     Cervical: No cervical adenopathy.  Skin:    General: Skin is warm.     Capillary Refill: Capillary refill takes less than 2 seconds.     Coloration: Skin is not pale.     Findings: No abrasion, erythema, petechiae or rash. Rash is not papular, urticarial or vesicular.     Comments: Roughened skin on the bilateral hands. There is some scaling noted.   Neurological:     Mental Status: He is alert.  Psychiatric:        Behavior: Behavior is cooperative.     Diagnostic studies:   Spirometry: results normal (FEV1: 2.59/90%, FVC: 3.04/82%, FEV1/FVC: 85%).    Spirometry consistent with normal pattern.   Allergy Studies: none        Malachi Bonds, MD  [facility]

## 2021-06-25 ENCOUNTER — Ambulatory Visit (INDEPENDENT_AMBULATORY_CARE_PROVIDER_SITE_OTHER): Payer: BC Managed Care – PPO

## 2021-06-25 DIAGNOSIS — J309 Allergic rhinitis, unspecified: Secondary | ICD-10-CM | POA: Diagnosis not present

## 2021-07-23 ENCOUNTER — Ambulatory Visit (INDEPENDENT_AMBULATORY_CARE_PROVIDER_SITE_OTHER): Payer: BC Managed Care – PPO

## 2021-07-23 DIAGNOSIS — J309 Allergic rhinitis, unspecified: Secondary | ICD-10-CM | POA: Diagnosis not present

## 2021-07-30 ENCOUNTER — Ambulatory Visit (INDEPENDENT_AMBULATORY_CARE_PROVIDER_SITE_OTHER): Payer: BC Managed Care – PPO

## 2021-07-30 DIAGNOSIS — J309 Allergic rhinitis, unspecified: Secondary | ICD-10-CM | POA: Diagnosis not present

## 2021-08-06 ENCOUNTER — Ambulatory Visit (INDEPENDENT_AMBULATORY_CARE_PROVIDER_SITE_OTHER): Payer: BC Managed Care – PPO | Admitting: *Deleted

## 2021-08-06 DIAGNOSIS — J309 Allergic rhinitis, unspecified: Secondary | ICD-10-CM | POA: Diagnosis not present

## 2021-09-03 ENCOUNTER — Ambulatory Visit (INDEPENDENT_AMBULATORY_CARE_PROVIDER_SITE_OTHER): Payer: BC Managed Care – PPO

## 2021-09-03 DIAGNOSIS — J309 Allergic rhinitis, unspecified: Secondary | ICD-10-CM | POA: Diagnosis not present

## 2021-10-01 ENCOUNTER — Ambulatory Visit (INDEPENDENT_AMBULATORY_CARE_PROVIDER_SITE_OTHER): Payer: BC Managed Care – PPO

## 2021-10-01 DIAGNOSIS — J309 Allergic rhinitis, unspecified: Secondary | ICD-10-CM

## 2021-10-29 ENCOUNTER — Ambulatory Visit (INDEPENDENT_AMBULATORY_CARE_PROVIDER_SITE_OTHER): Payer: BC Managed Care – PPO

## 2021-10-29 DIAGNOSIS — J309 Allergic rhinitis, unspecified: Secondary | ICD-10-CM | POA: Diagnosis not present

## 2021-11-26 ENCOUNTER — Ambulatory Visit (INDEPENDENT_AMBULATORY_CARE_PROVIDER_SITE_OTHER): Payer: BC Managed Care – PPO

## 2021-11-26 DIAGNOSIS — J309 Allergic rhinitis, unspecified: Secondary | ICD-10-CM | POA: Diagnosis not present

## 2021-12-08 ENCOUNTER — Other Ambulatory Visit: Payer: Self-pay | Admitting: Family Medicine

## 2021-12-08 DIAGNOSIS — R2241 Localized swelling, mass and lump, right lower limb: Secondary | ICD-10-CM

## 2021-12-10 ENCOUNTER — Ambulatory Visit: Payer: BC Managed Care – PPO | Admitting: Allergy & Immunology

## 2021-12-10 ENCOUNTER — Other Ambulatory Visit: Payer: Self-pay

## 2021-12-10 ENCOUNTER — Encounter: Payer: Self-pay | Admitting: Allergy & Immunology

## 2021-12-10 VITALS — BP 128/70 | HR 82 | Resp 16

## 2021-12-10 DIAGNOSIS — J4540 Moderate persistent asthma, uncomplicated: Secondary | ICD-10-CM

## 2021-12-10 DIAGNOSIS — K219 Gastro-esophageal reflux disease without esophagitis: Secondary | ICD-10-CM

## 2021-12-10 DIAGNOSIS — J3089 Other allergic rhinitis: Secondary | ICD-10-CM | POA: Diagnosis not present

## 2021-12-10 DIAGNOSIS — J302 Other seasonal allergic rhinitis: Secondary | ICD-10-CM

## 2021-12-10 MED ORDER — OMEPRAZOLE 40 MG PO CPDR: 40.0000 mg | DELAYED_RELEASE_CAPSULE | Freq: Every day | ORAL | 3 refills | Status: DC

## 2021-12-10 MED ORDER — MONTELUKAST SODIUM 10 MG PO TABS: ORAL_TABLET | ORAL | 3 refills | Status: DC

## 2021-12-10 MED ORDER — ALBUTEROL SULFATE HFA 108 (90 BASE) MCG/ACT IN AERS: INHALATION_SPRAY | RESPIRATORY_TRACT | 1 refills | Status: DC

## 2021-12-10 NOTE — Progress Notes (Signed)
? ?FOLLOW UP ? ?Date of Service/Encounter:  12/10/21 ? ? ?Assessment:  ? ?Moderate persistent asthma without complication - now asymptomatic without medications likely secondary to allergic rhinitis control and GERD control ?  ?Seasonal and perennial allergic rhinitis - on allergen immunotherapy (maintenance reached April 2018) - now stopping since we have reached 5 years  ?  ?GERD - on PPI + H2 blocker with good control ?  ? ?Plan/Recommendations:  ? ?1. Seasonal and perennial allergic rhinitis - now at FIVE YEARS MAINTENANCE ?- Continue with allergy shots at the same schedule. ?- We are going to finish out your current vials and then stop allergy shots.  ?- Continue with Flonase one spray per nostril 1-2 times daily. ?- Continue with Singulair daily.  ? ?2. Moderate persistent asthma without complication ?- Lung testing looks fairly good today. ?- Daily controller medication(s): NOTHING ?- Prior to physical activity: albuterol 2 puffs 10-15 minutes before physical activity. ?- Rescue medications: albuterol 4 puffs every 4-6 hours as needed ?- Asthma control goals:  ?* Full participation in all desired activities (may need albuterol before activity) ?* Albuterol use two time or less a week on average (not counting use with activity) ?* Cough interfering with sleep two time or less a month ?* Oral steroids no more than once a year ?* No hospitalizations ? ?3. Return in about 1 year (around 12/11/2022).  ? ? ?Subjective:  ? ?Antonio Holmes is a 61 y.o. male presenting today for follow up of  ?Chief Complaint  ?Patient presents with  ? Asthma  ? Allergic Rhinitis   ? ? ?[name] has a history of the following: ?Patient Active Problem List  ? Diagnosis Date Noted  ? Rash and other nonspecific skin eruption 08/13/2020  ? Left knee pain 12/05/2019  ? Allergic conjunctivitis 02/28/2019  ? Asthma with acute exacerbation 06/22/2016  ? Perennial and seasonal allergic rhinitis 02/17/2016  ? History of esophageal  reflux 02/17/2016  ? Cough, persistent 02/17/2016  ? Moderate persistent asthma without complication 09/20/2013  ? ? ?History obtained from: chart review and patient. ? ?Antonio Holmes is a 61 y.o. male presenting for a follow up visit.  He was last seen in October 2022.  At that time, we continue with his allergy shots as well as Flonase and Singulair.  His lung testing looked fairly good.  We continued with albuterol as needed. ? ?Since last visit, he has mostly done well. He is quite chatty today as usual. ? ?Asthma/Respiratory Symptom History: He was using a chain saw last week cutting down small trees. He will get a bit winded once in a while and he does fine. He reaches for his albuterol 1-2 times per month. He does not remember the last time that he used it.  He cut down around 30-40 trees, so he would cut down 10 or 12 and then sit down a minute or two. HE thinks that the SOB is more related to his weight.  ? 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  ? ?Otherwise, there have been no changes to his past medical history, surgical history, family history, or social history. ? ? ? ?Review of Systems  ?Constitutional: Negative.  Negative for chills, fever, malaise/fatigue and weight loss.  ?HENT: Negative.  Negative for congestion, ear discharge, ear pain and sinus pain.   ?Eyes:  Negative for pain, discharge and redness.  ?Respiratory:  Negative for  cough, sputum production, shortness of breath and wheezing.   ?Cardiovascular: Negative.  Negative for chest pain and palpitations.  ?Gastrointestinal:  Negative for abdominal pain, constipation, diarrhea, heartburn, nausea and vomiting.  ?Skin: Negative.  Negative for itching and rash.  ?Neurological:  Negative for dizziness and headaches.  ?Endo/Heme/Allergies:  Negative for environmental allergies. Does not bruise/bleed easily.   ? ? ? ?Objective:  ? ?Blood pressure 128/70, pulse 82, resp. rate 16, SpO2  97 %. ?There is no height or weight on file to calculate BMI. ? ? ? ?Physical Exam ?Vitals reviewed.  ?Constitutional:   ?   Appearance: He is well-developed.  ?   Comments: Pleasant and smiling.   ?HENT:  ?   Head: Normocephalic and atraumatic.  ?   Right Ear: Tympanic membrane, ear canal and external ear normal.  ?   Left Ear: Tympanic membrane, ear canal and external ear normal.  ?   Nose: No nasal deformity, septal deviation, mucosal edema or rhinorrhea.  ?   Right Turbinates: Enlarged, swollen and pale.  ?   Left Turbinates: Enlarged, swollen and pale.  ?   Right Sinus: No maxillary sinus tenderness or frontal sinus tenderness.  ?   Left Sinus: No maxillary sinus tenderness or frontal sinus tenderness.  ?   Mouth/Throat:  ?   Mouth: Mucous membranes are not pale and not dry.  ?   Pharynx: Uvula midline.  ?Eyes:  ?   General: Lids are normal. No allergic shiner.    ?   Right eye: No discharge.     ?   Left eye: No discharge.  ?   Conjunctiva/sclera: Conjunctivae normal.  ?   Right eye: Right conjunctiva is not injected. No chemosis. ?   Left eye: Left conjunctiva is not injected. No chemosis. ?   Pupils: Pupils are equal, round, and reactive to light.  ?Cardiovascular:  ?   Rate and Rhythm: Normal rate and regular rhythm.  ?   Heart sounds: Normal heart sounds.  ?Pulmonary:  ?   Effort: Pulmonary effort is normal. No tachypnea, accessory muscle usage, respiratory distress or retractions.  ?   Breath sounds: Normal breath sounds. No wheezing, rhonchi or rales.  ?   Comments: Moving air well in all lung fields. No increased work of breathing noted.  ?Chest:  ?   Chest wall: No tenderness.  ?Lymphadenopathy:  ?   Cervical: No cervical adenopathy.  ?Skin: ?   General: Skin is warm.  ?   Capillary Refill: Capillary refill takes less than 2 seconds.  ?   Coloration: Skin is not pale.  ?   Findings: No abrasion, erythema, petechiae or rash. Rash is not papular, urticarial or vesicular.  ?   Comments: Roughened skin on  the bilateral hands. There is some scaling noted.   ?Neurological:  ?   Mental Status: He is alert.  ?Psychiatric:     ?   Behavior: Behavior is cooperative.  ?  ? ?Diagnostic studies:   ? ?Spirometry: results normal (FEV1: 2.51/87%, FVC: 2.93/80%, FEV1/FVC: 86%).  ?  ?Spirometry consistent with normal pattern.  ? ?Allergy Studies: none ? ? ? ? ? ? ? ?  ?Malachi Bonds, MD  ?[facility] ? ? ? ? ? ? ?

## 2021-12-10 NOTE — Patient Instructions (Addendum)
1. Seasonal and perennial allergic rhinitis - now at [place] ?- Continue with allergy shots at the same schedule. ?- We are going to finish out your current vials and then stop allergy shots.  ?- Continue with Flonase one spray per nostril 1-2 times daily. ?- Continue with Singulair daily.  ? ?2. Moderate persistent asthma without complication ?- Lung testing looks fairly good today. ?- Daily controller medication(s): NOTHING ?- Prior to physical activity: albuterol 2 puffs 10-15 minutes before physical activity. ?- Rescue medications: albuterol 4 puffs every 4-6 hours as needed ?- Asthma control goals:  ?* Full participation in all desired activities (may need albuterol before activity) ?* Albuterol use two time or less a week on average (not counting use with activity) ?* Cough interfering with sleep two time or less a month ?* Oral steroids no more than once a year ?* No hospitalizations ? ?3. Return in about 1 year (around 12/11/2022).  ? ? ?Please inform us of any Emergency Department visits, hospitalizations, or changes in symptoms. Call us before going to the ED for breathing or allergy symptoms since we might be able to fit you in for a sick visit. Feel free to contact us anytime with any questions, problems, or concerns. ? ?It was a pleasure to see you again today! ? ?Websites that have reliable patient information: ?1. American Academy of Asthma, Allergy, and Immunology: www.aaaai.org ?2. Food Allergy Research and Education (FARE): foodallergy.org ?3. Mothers of Asthmatics: http://www.asthmacommunitynetwork.org ?4. [employer] of Allergy, Asthma, and Immunology: MonthlyElectricBill.co.uk ? ? ?COVID-19 Vaccine Information can be found at: ShippingScam.co.uk For questions related to vaccine distribution or appointments, please email [email or web address] or call [phone].  ? ? ? ??Like? [place] on Facebook and Instagram for our latest updates!   ?  ? ? ? ?Make sure you are registered to vote! If you have moved or changed any of your contact information, you will need to get this updated before voting! ? ?In some cases, you MAY be able to register to vote online: [email or web address] ? ? ? ? ? ?

## 2021-12-26 ENCOUNTER — Ambulatory Visit (INDEPENDENT_AMBULATORY_CARE_PROVIDER_SITE_OTHER): Payer: BC Managed Care – PPO

## 2021-12-26 DIAGNOSIS — J309 Allergic rhinitis, unspecified: Secondary | ICD-10-CM

## 2022-01-01 ENCOUNTER — Ambulatory Visit
Admission: RE | Admit: 2022-01-01 | Discharge: 2022-01-01 | Disposition: A | Discharge status: Home / Self Care | Payer: BC Managed Care – PPO | Source: Ambulatory Visit | Attending: Family Medicine | Admitting: Family Medicine

## 2022-01-01 DIAGNOSIS — R2241 Localized swelling, mass and lump, right lower limb: Secondary | ICD-10-CM

## 2022-01-01 MED ORDER — GADOBENATE DIMEGLUMINE 529 MG/ML IV SOLN
18.0000 mL | Freq: Once | INTRAVENOUS | Status: AC | PRN
  Administered 2022-01-01: 18 mL via INTRAVENOUS

## 2022-01-21 ENCOUNTER — Ambulatory Visit (INDEPENDENT_AMBULATORY_CARE_PROVIDER_SITE_OTHER): Payer: BC Managed Care – PPO

## 2022-01-21 DIAGNOSIS — J309 Allergic rhinitis, unspecified: Secondary | ICD-10-CM

## 2022-02-09 DIAGNOSIS — J3089 Other allergic rhinitis: Secondary | ICD-10-CM

## 2022-02-09 NOTE — Progress Notes (Signed)
VIALS EXP 02-10-23 

## 2022-02-09 NOTE — Progress Notes (Signed)
Aeroallergen Immunotherapy    Patient Details  Name: Antonio Holmes  MRN: [id number]  Date of Birth: Apr 14, 1961   Order 1 of 2   Vial Label: G/Weeds/T   0.3 ml (Volume)  BAU Concentration -- 7 Grass Mix* 100,000 ([address] [place], [place], [place], [place], RedTop, Sweet Vernal, Timothy)  0.2 ml (Volume)  1:20 Concentration -- [place]  0.2 ml (Volume)  BAU Concentration -- [place] 10,000  0.2 ml (Volume)  1:20 Concentration -- [place]  0.3 ml (Volume)  1:20 Concentration -- Ragweed Mix  0.5 ml (Volume)  1:20 Concentration -- Weed Mix*  0.5 ml (Volume)  1:20 Concentration -- Eastern 10 Tree Mix (also Sweet Gum)  0.2 ml (Volume)  1:20 Concentration -- Box Elder  0.2 ml (Volume)  1:10 Concentration -- Cedar, red  0.2 ml (Volume)  1:10 Concentration -- Pecan Pollen  0.2 ml (Volume)  1:10 Concentration -- Pine Mix  0.2 ml (Volume)  1:20 Concentration -- Walnut, Black Pollen    3.2  ml Extract Subtotal  1.8  ml Diluent  5.0  ml Maintenance Total    Final Concentration above is stated in weight/volume (wt/vol).  Allergen units (AU/ml) biological units (BAU/ml).  The total volume is 5 ml.     Schedule:  A   Special Instructions: 1 or 2  times per week with at least 72 hours in between.

## 2022-02-09 NOTE — Progress Notes (Signed)
Aeroallergen Immunotherapy    Patient Details  Name: Antonio Holmes  MRN: [id number]  Date of Birth: 1961-06-05   Order 2 of 2   Vial Label: M/DM/C/D/CR   0.2 ml (Volume)  1:20 Concentration -- Alternaria alternata  0.2 ml (Volume)  1:20 Concentration -- Cladosporium herbarum  0.2 ml (Volume)  1:10 Concentration -- Aspergillus mix  0.2 ml (Volume)  1:10 Concentration -- Penicillium mix  0.2 ml (Volume)  1:20 Concentration -- Bipolaris sorokiniana  0.2 ml (Volume)  1:20 Concentration -- Drechslera spicifera  0.2 ml (Volume)  1:10 Concentration -- Mucor plumbeus  0.2 ml (Volume)  1:10 Concentration -- Fusarium moniliforme  0.2 ml (Volume)  1:40 Concentration -- Aureobasidium pullulans  0.2 ml (Volume)  1:10 Concentration -- Rhizopus oryzae  0.4 ml (Volume)   AU Concentration -- Mite Mix (DF 5,000 & DP 5,000)  0.5 ml (Volume)  1:10 Concentration -- Cat Hair  0.5 ml (Volume)  1:10 Concentration -- Dog Epithelia  0.3 ml (Volume)  1:20 Concentration -- Cockroach, German    3.7  ml Extract Subtotal  1.3  ml Diluent  5.0  ml Maintenance Total    Final Concentration above is stated in weight/volume (wt/vol).  Allergen units (AU/ml) biological units (BAU/ml).  The total volume is 5 ml.     Schedule:  A   Special Instructions: 1 or 2  times per week with at least 72 hours in between.

## 2022-02-18 ENCOUNTER — Ambulatory Visit (INDEPENDENT_AMBULATORY_CARE_PROVIDER_SITE_OTHER): Payer: BC Managed Care – PPO

## 2022-02-18 DIAGNOSIS — J309 Allergic rhinitis, unspecified: Secondary | ICD-10-CM

## 2022-03-18 ENCOUNTER — Encounter: Payer: Self-pay | Admitting: Orthopaedic Surgery

## 2022-03-18 ENCOUNTER — Ambulatory Visit (INDEPENDENT_AMBULATORY_CARE_PROVIDER_SITE_OTHER): Payer: BC Managed Care – PPO

## 2022-03-18 ENCOUNTER — Ambulatory Visit: Payer: BC Managed Care – PPO | Admitting: Orthopaedic Surgery

## 2022-03-18 DIAGNOSIS — J309 Allergic rhinitis, unspecified: Secondary | ICD-10-CM | POA: Diagnosis not present

## 2022-03-18 DIAGNOSIS — R2241 Localized swelling, mass and lump, right lower limb: Secondary | ICD-10-CM

## 2022-03-18 NOTE — Progress Notes (Signed)
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  This was first diagnosed in 2021 after a CT of the abdomen and pelvis was obtained due to kidney stones.  The intramuscular lipoma was seen then on the CT scan and a follow-up MRI at that time showed this as well.  He had a second MRI for comparison purposes this year recently and had noted that he felt that it was more noticeable visibly at his right medial thigh and he feels like it is grown some.  The recent MRI when compared to his previous MRI does show just a slight increase in size of this mass.  He is otherwise asymptomatic from it except for when he forward flexes at the lower back with flexing his hip he can feel some type of sensation.  This does not wake him up at night and there is no weakness that he describes in his right lower extremity or numbness and tingling going down his right leg.  He denies any other significant medical issues.  He is not a diabetic.  His wife is with him today as well.  I was able to review his medications as well as past medical history in epic.  On inspection with him standing there is visibly a difference of the size of his right thigh medially when comparing both sides.  I can palpate a mass in this area.  There is no significant skin changes today and his motor and sensory exam of the right lower extremity is normal.  He has fluid and full range of motion of his right hip and his right knee.  I was able to pull up the previous CT scan from 2021 as well as the MRI from 2021 and 2023.  I was able to go through the scan with the patient and his wife and to show them what this involves.  Right now there was no enhancing features around it to suggest that it is aggressive and it does appear benign.  The bigger concern to me though is the fact that he does feel like it is grown over more  recent time period.  Also on reviewing the MRI, it is quite extensive and it is close to medial neurovascular structures.  I talked to him and his wife in detail about this mass.  I would feel more comfortable sending this to one of my colleagues at [employer] ([facility]) who is an orthopedic oncologist and specializes in masses such as this.  I will have my office reach out to Dr. Toy Baker for further evaluation and treatment of this complex intramuscular right thigh lipoma.  I explained that referral such as this is more important and appropriate given the fact that there is a small chance that this could evolve into something more worrisome and given the sheer size of this mass as well as this location.  The patient agrees with this referral and treatment plan.

## 2022-03-19 ENCOUNTER — Other Ambulatory Visit: Payer: Self-pay

## 2022-03-19 DIAGNOSIS — R2241 Localized swelling, mass and lump, right lower limb: Secondary | ICD-10-CM

## 2022-11-07 IMAGING — MR MR FEMUR*R* WO/W CM
7 of 9 series · 28 of 40 positions shown · IV contrast (Multihance 18 ml)
Comparison: MRI examination dated February 01, 2020

CLINICAL DATA: Right thigh swelling. Patient has a mass that was on
the upper thigh now has moved towards the groin.

EXAM:
MRI OF THE RIGHT FEMUR WITHOUT AND WITH CONTRAST
TECHNIQUE: Multiplanar, multisequence MR imaging of the right femur was
performed both before and after administration of intravenous
contrast.
CONTRAST:  18mL MULTIHANCE GADOBENATE DIMEGLUMINE 529 MG/ML IV SOLN

[Series 3: T1 · coronal · right · 3.0mm · 1.60mm/px · 4 of 53 slices shown (1 of 2)]
[im 1/53]
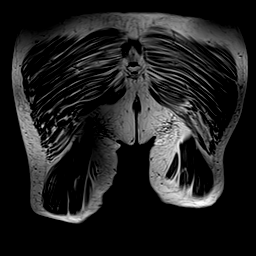
[im 18/53]
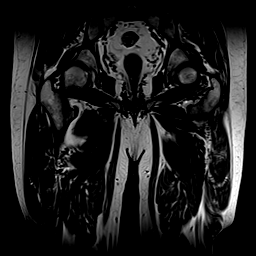
[im 35/53]
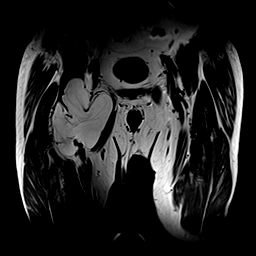
[im 53/53]
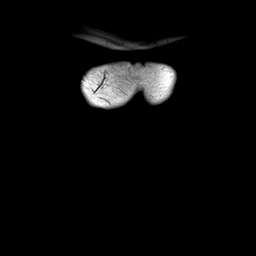

[Series 5: T1 · axial · right · 5.0mm · 0.43mm/px · z∈[-134,+148]mm · 4 of 48 slices shown (2 of 2)]
[im 1/48]
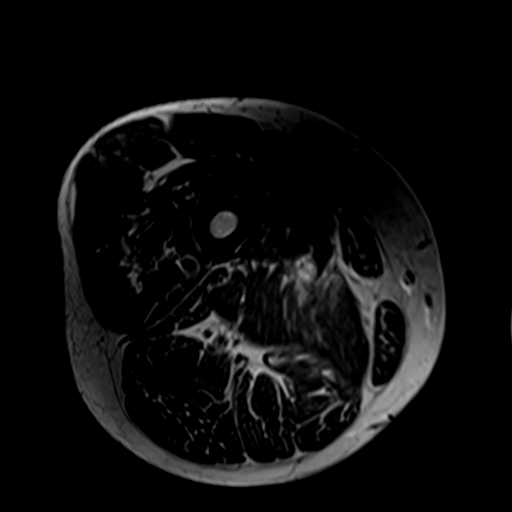
[im 16/48]
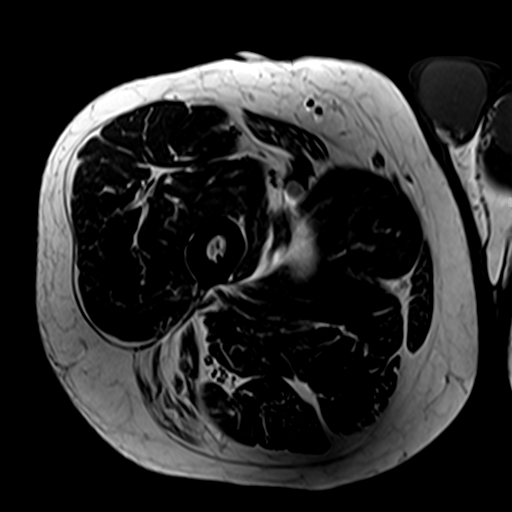
[im 32/48]
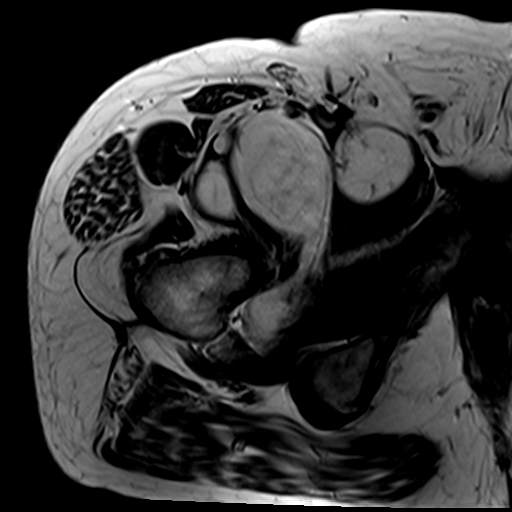
[im 48/48]
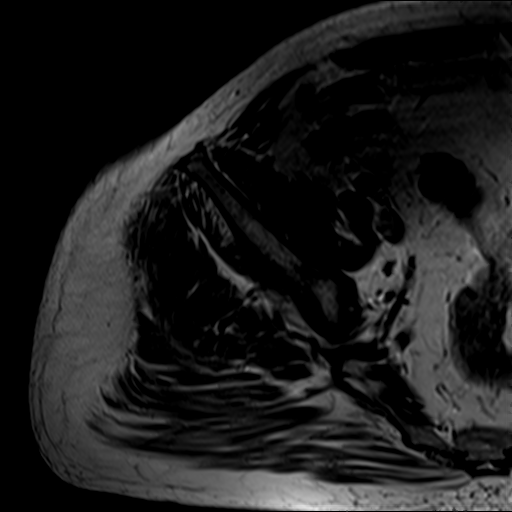

[Series 6: T2 fat-sat · axial · right · 5.0mm · 0.43mm/px · z∈[-134,+148]mm · 4 of 48 slices shown (1 of 2)]
[im 1/48]
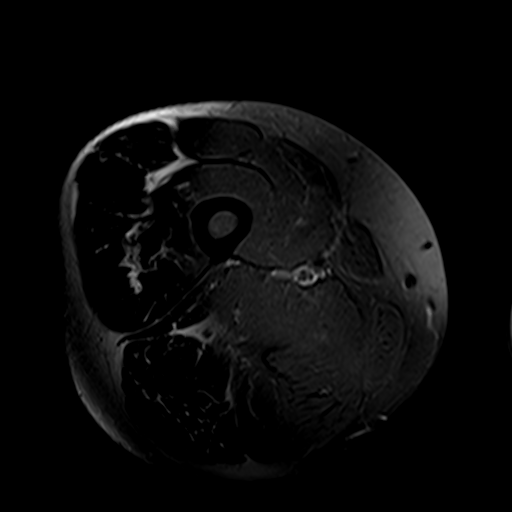
[im 16/48]
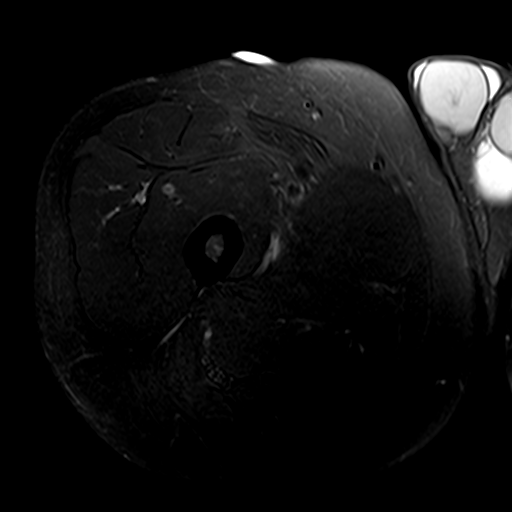
[im 32/48]
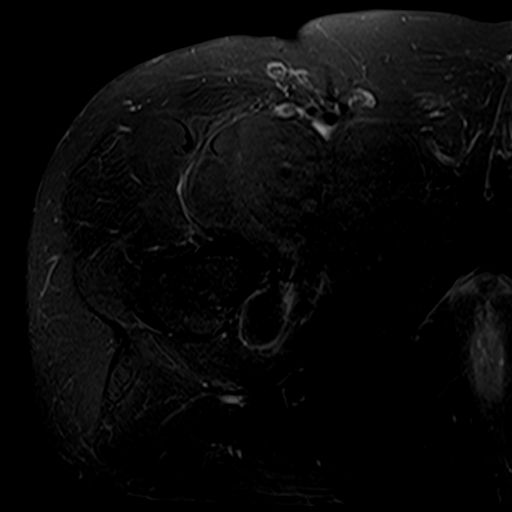
[im 48/48]
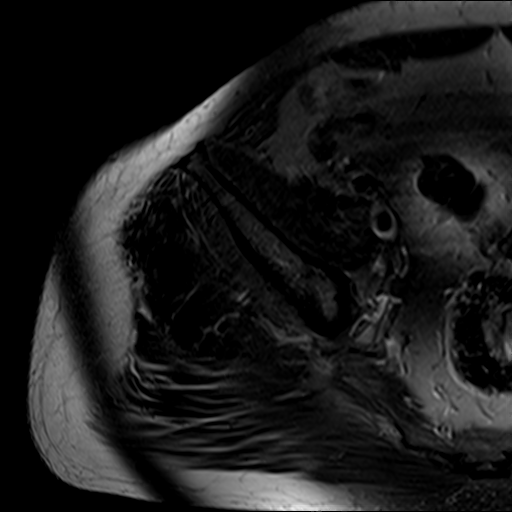

[Series 7: T2 fat-sat · sagittal · right · 3.0mm · 0.62mm/px · 5 of 53 slices shown (2 of 2)]
[im 1/53]
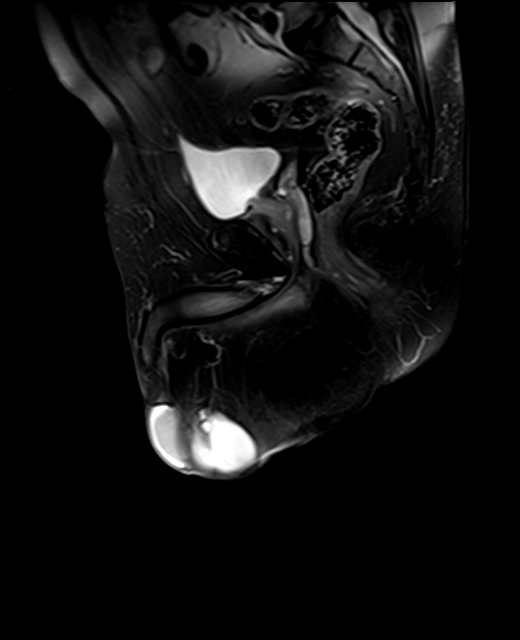
[im 14/53]
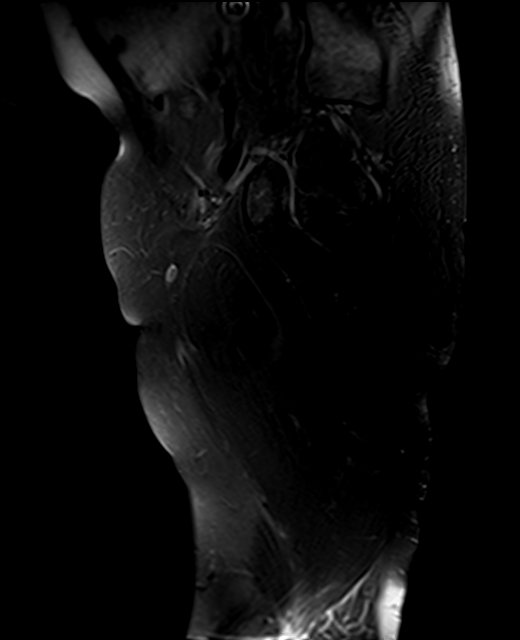
[im 27/53]
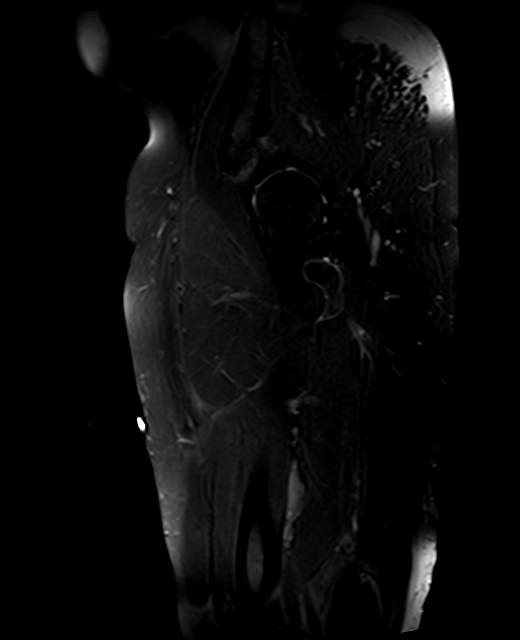
[im 40/53]
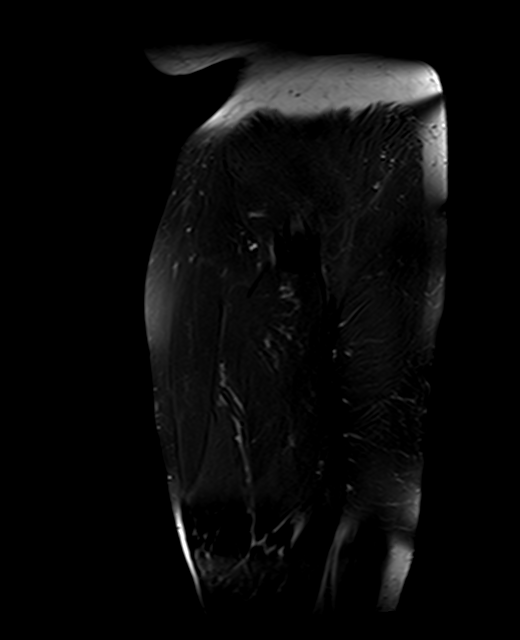
[im 53/53]
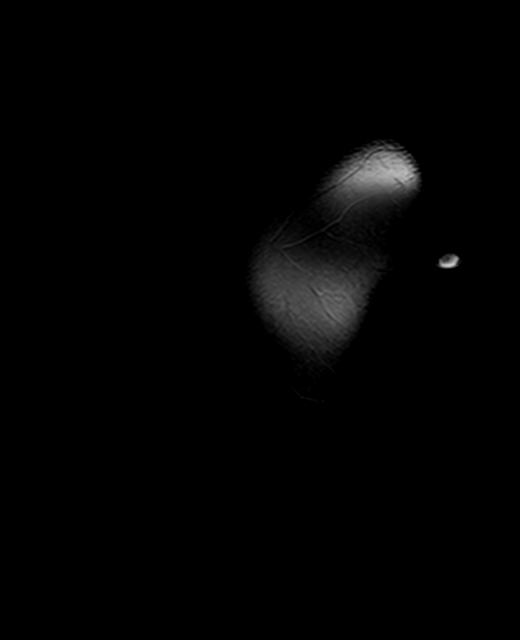

[Series 8: T1 fat-sat · axial · right · 5.0mm · 0.86mm/px · z∈[-134,+148]mm · 4 of 48 slices shown (1 of 2)]
[im 1/48]
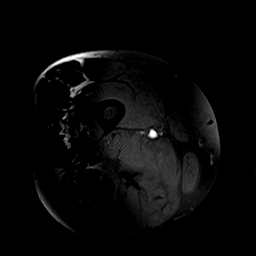
[im 16/48]
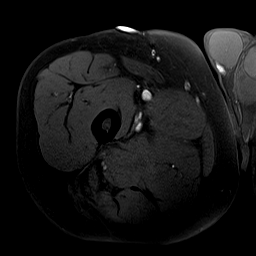
[im 32/48]
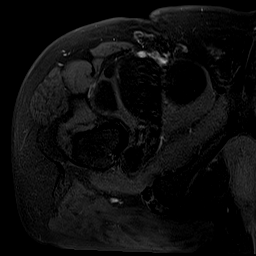
[im 48/48]
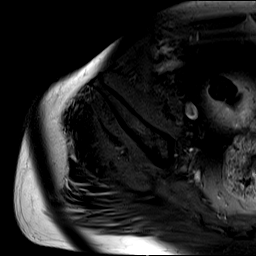

[Series 9: T1 fat-sat · axial · right · 5.0mm · 0.86mm/px · z∈[-134,+148]mm · 4 of 48 slices shown (2 of 2)]
[im 1/48]
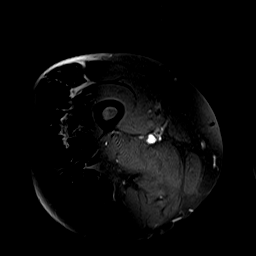
[im 16/48]
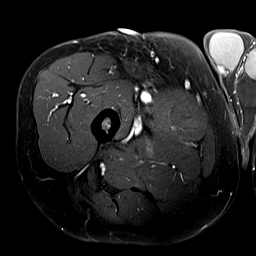
[im 32/48]
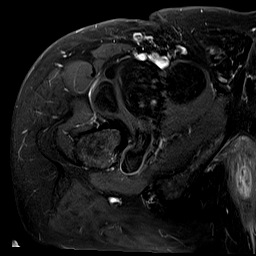
[im 48/48]
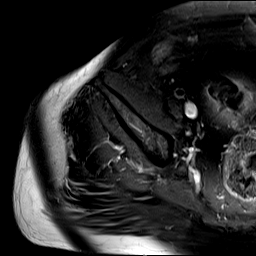

[Series 10: T1 fat-sat post-contrast · coronal · right · 3.0mm · 1.56mm/px · 3 of 53 slices shown]
[im 1/53]
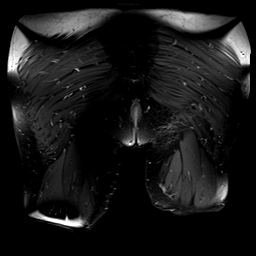
[im 14/53]
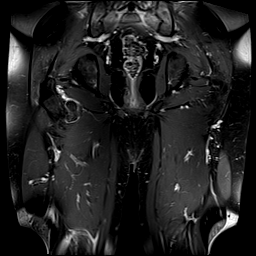
[im 27/53]
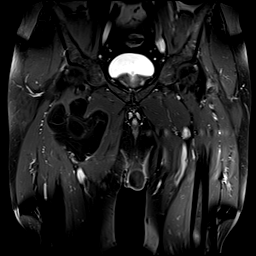

[28 of 40 positions shown; findings below may reference images not displayed]

FINDINGS: Bones:

No hip fracture, dislocation or avascular necrosis.

No periosteal reaction or bone destruction. No aggressive osseous
lesion.

Normal sacrum and sacroiliac joints. No SI joint widening or erosive
changes.

Articular cartilage and labrum

Articular cartilage:  No chondral defect.

Labrum: Grossly intact, but evaluation is limited by lack of
intraarticular fluid.

Joint or bursal effusion

Joint effusion:  No hip joint effusion.  No SI joint effusion.

Bursae:  No bursa formation.

Muscles and tendons

Flexors: Normal.

Extensors: Normal.

Abductors: Normal.

Adductors: Normal.

Gluteals: Normal.

Hamstrings: Normal.

Other findings

Trident shaped intra muscular lipoma now measuring approximately
x 9.9 x 14.0 cm, (previously measuring approximately 11.0 x 10.0 x
13.5 cm) slightly increased from prior examination. The mass
completely suppresses on fat suppressed sequence without evidence of
enhancement or thick enhancing septations. The mass has slightly
increased in size from prior examination and has mass effect on the
adductor muscles in the proximal thigh. The mass abuts the major
vessels of the right thigh which are however patent.
IMPRESSION: 1. Mild increase in size of the intramuscular lipoma in the right
proximal thigh abutting the iliopsoas tendon and adductor muscles.
No abnormal enhancement or surrounding inflammatory changes.
Findings most consistent with benign lipoma, continued follow-up is
recommended as clinically warranted.

2.  Mild right hip osteoarthritis.

3.  No acute osseous or soft tissue abnormality.

## 2022-12-08 NOTE — H&P (Signed)
Surgical History & Physical  Patient Name: Oneill Bais DOB: 1960-10-24  Surgery: Cataract extraction with intraocular lens implant phacoemulsification; Left Eye  Surgeon: Fabio Pierce MD Surgery Date:  12-11-22 Pre-Op Date:  11-23-22  HPI: A 57 Yr. old male patient is referred by Dr Charise Killian for cataract eval. 1.The patient complains of nighttime light - car headlights, street lamps etc. glare causing poor vision/reading small prints, seeing small captions on TV, which began 1 month ago. Both eyes are affected. The episode is gradual. The condition's severity increased since last visit. Symptoms occur when the patient is driving, outside and reading. This is negatively affecting the patient's quality of life and the patient is unable to function adequately in life with the current level of vision. HPI was performed by Fabio Pierce .  Medical History: Cataracts Arthritis Lung Problems  Review of Systems Respiratory Asthma All recorded systems are negative except as noted above.  Social   Former smoker   Medication Symbicort Chief Executive Officer, Flonase, Singulair, Xyzal, Albuterol,   Sx/Procedures Knee sx, Fatty benign tumor removal right leg,   Drug Allergies  Tramadol,   History & Physical: Heent: cataract, left eye NECK: supple without bruits LUNGS: lungs clear to auscultation CV: regular rate and rhythm Abdomen: soft and non-tender Impression & Plan: Assessment: 1.  NUCLEAR SCLEROSIS AGE RELATED; Both Eyes (H25.13) 2.  BLEPHARITIS; Right Upper Lid, Right Lower Lid, Left Upper Lid, Left Lower Lid (H01.001, H01.002,H01.004,H01.005) 3.  Pinguecula; Both Eyes (H11.153) 4.  ASTIGMATISM, REGULAR; Both Eyes (H52.223)  Plan: 1.  Cataract accounts for the patient's decreased vision. This visual impairment is not correctable with a tolerable change in glasses or contact lenses. Cataract surgery with an implantation of a new lens should significantly improve the visual and  functional status of the patient. Discussed all risks, benefits, alternatives, and potential complications. Discussed the procedures and recovery. Patient desires to have surgery. A-scan ordered and performed today for intra-ocular lens calculations. The surgery will be performed in order to improve vision for driving, reading, and for eye examinations. Recommend phacoemulsification with intra-ocular lens. Recommend Dextenza for post-operative pain and inflammation. Left Eye worse - first. Dilates poorly - shugarcaine by protocol. Malyugin Ring. Omidira. Toric IOL - Vivity vs. Eyhance.  2.  Recommend regular lid cleaning. Warm compresses 7-10 minutes every day, both eyes.  3.  Observe; Artificial tears as needed for irritation.  4.  Toric IOL OS only.

## 2022-12-09 ENCOUNTER — Encounter (HOSPITAL_COMMUNITY)
Admission: RE | Admit: 2022-12-09 | Discharge: 2022-12-09 | Disposition: A | Discharge status: Home / Self Care | Payer: BC Managed Care – PPO | Source: Ambulatory Visit | Attending: Ophthalmology | Admitting: Ophthalmology

## 2022-12-09 ENCOUNTER — Encounter (HOSPITAL_COMMUNITY): Payer: Self-pay

## 2022-12-11 ENCOUNTER — Encounter (HOSPITAL_COMMUNITY): Payer: Self-pay | Admitting: Ophthalmology

## 2022-12-11 ENCOUNTER — Ambulatory Visit (HOSPITAL_COMMUNITY)
Admission: RE | Admit: 2022-12-11 | Discharge: 2022-12-11 | Disposition: A | Discharge status: Home / Self Care | Payer: BC Managed Care – PPO | Source: Ambulatory Visit | Attending: Ophthalmology | Admitting: Ophthalmology

## 2022-12-11 ENCOUNTER — Ambulatory Visit (HOSPITAL_COMMUNITY): Payer: BC Managed Care – PPO | Admitting: Anesthesiology

## 2022-12-11 ENCOUNTER — Other Ambulatory Visit: Payer: Self-pay

## 2022-12-11 ENCOUNTER — Encounter (HOSPITAL_COMMUNITY)
Admission: RE | Disposition: A | Discharge status: Home / Self Care | Payer: Self-pay | Source: Ambulatory Visit | Attending: Ophthalmology

## 2022-12-11 DIAGNOSIS — H52222 Regular astigmatism, left eye: Secondary | ICD-10-CM | POA: Insufficient documentation

## 2022-12-11 DIAGNOSIS — H01001 Unspecified blepharitis right upper eyelid: Secondary | ICD-10-CM | POA: Diagnosis not present

## 2022-12-11 DIAGNOSIS — Z09 Encounter for follow-up examination after completed treatment for conditions other than malignant neoplasm: Secondary | ICD-10-CM | POA: Insufficient documentation

## 2022-12-11 DIAGNOSIS — H11153 Pinguecula, bilateral: Secondary | ICD-10-CM | POA: Diagnosis not present

## 2022-12-11 DIAGNOSIS — H01005 Unspecified blepharitis left lower eyelid: Secondary | ICD-10-CM | POA: Insufficient documentation

## 2022-12-11 DIAGNOSIS — H01002 Unspecified blepharitis right lower eyelid: Secondary | ICD-10-CM | POA: Insufficient documentation

## 2022-12-11 DIAGNOSIS — H01004 Unspecified blepharitis left upper eyelid: Secondary | ICD-10-CM | POA: Insufficient documentation

## 2022-12-11 DIAGNOSIS — H2181 Floppy iris syndrome: Secondary | ICD-10-CM | POA: Diagnosis not present

## 2022-12-11 DIAGNOSIS — K219 Gastro-esophageal reflux disease without esophagitis: Secondary | ICD-10-CM | POA: Insufficient documentation

## 2022-12-11 DIAGNOSIS — H2512 Age-related nuclear cataract, left eye: Secondary | ICD-10-CM | POA: Diagnosis present

## 2022-12-11 DIAGNOSIS — Z87891 Personal history of nicotine dependence: Secondary | ICD-10-CM | POA: Diagnosis not present

## 2022-12-11 HISTORY — PX: CATARACT EXTRACTION W/PHACO: SHX586

## 2022-12-11 SURGERY — PHACOEMULSIFICATION, CATARACT, WITH IOL INSERTION
Anesthesia: Monitor Anesthesia Care | Site: Eye | Laterality: Left

## 2022-12-11 MED ORDER — PHENYLEPHRINE-KETOROLAC 1-0.3 % IO SOLN
INTRAOCULAR | Status: AC
  Filled 2022-12-11: qty 4

## 2022-12-11 MED ORDER — FENTANYL CITRATE (PF) 100 MCG/2ML IJ SOLN
INTRAMUSCULAR | Status: DC | PRN
  Administered 2022-12-11: 50 ug via INTRAVENOUS

## 2022-12-11 MED ORDER — MIDAZOLAM HCL 5 MG/5ML IJ SOLN
INTRAMUSCULAR | Status: DC | PRN
  Administered 2022-12-11: 1 mg via INTRAVENOUS

## 2022-12-11 MED ORDER — SODIUM CHLORIDE 0.9% FLUSH
INTRAVENOUS | Status: DC | PRN
  Administered 2022-12-11: 3 mL via INTRAVENOUS

## 2022-12-11 MED ORDER — TROPICAMIDE 1 % OP SOLN
1.0000 [drp] | OPHTHALMIC | Status: AC | PRN
  Administered 2022-12-11 (×3): 1 [drp] via OPHTHALMIC

## 2022-12-11 MED ORDER — PHENYLEPHRINE HCL 2.5 % OP SOLN
1.0000 [drp] | OPHTHALMIC | Status: AC | PRN
  Administered 2022-12-11 (×3): 1 [drp] via OPHTHALMIC

## 2022-12-11 MED ORDER — SODIUM HYALURONATE 23MG/ML IO SOSY
PREFILLED_SYRINGE | INTRAOCULAR | Status: DC | PRN
  Administered 2022-12-11: .6 mL via INTRAOCULAR

## 2022-12-11 MED ORDER — BSS IO SOLN
INTRAOCULAR | Status: DC | PRN
  Administered 2022-12-11: 15 mL via INTRAOCULAR

## 2022-12-11 MED ORDER — FENTANYL CITRATE (PF) 100 MCG/2ML IJ SOLN
INTRAMUSCULAR | Status: AC
  Filled 2022-12-11: qty 2

## 2022-12-11 MED ORDER — NEOMYCIN-POLYMYXIN-DEXAMETH 3.5-10000-0.1 OP SUSP
OPHTHALMIC | Status: DC | PRN
  Administered 2022-12-11: 2 [drp] via OPHTHALMIC

## 2022-12-11 MED ORDER — POVIDONE-IODINE 5 % OP SOLN
OPHTHALMIC | Status: DC | PRN
  Administered 2022-12-11: 1 via OPHTHALMIC

## 2022-12-11 MED ORDER — SODIUM HYALURONATE 10 MG/ML IO SOLUTION
PREFILLED_SYRINGE | INTRAOCULAR | Status: DC | PRN
  Administered 2022-12-11: .85 mL via INTRAOCULAR

## 2022-12-11 MED ORDER — TETRACAINE HCL 0.5 % OP SOLN
1.0000 [drp] | OPHTHALMIC | Status: AC | PRN
  Administered 2022-12-11 (×3): 1 [drp] via OPHTHALMIC

## 2022-12-11 MED ORDER — MIDAZOLAM HCL 2 MG/2ML IJ SOLN
INTRAMUSCULAR | Status: AC
  Filled 2022-12-11: qty 2

## 2022-12-11 MED ORDER — LIDOCAINE HCL (PF) 1 % IJ SOLN
INTRAOCULAR | Status: DC | PRN
  Administered 2022-12-11: 1 mL via OPHTHALMIC

## 2022-12-11 MED ORDER — PHENYLEPHRINE-KETOROLAC 1-0.3 % IO SOLN
INTRAOCULAR | Status: DC | PRN
  Administered 2022-12-11: 500 mL via OPHTHALMIC

## 2022-12-11 MED ORDER — STERILE WATER FOR IRRIGATION IR SOLN
Status: DC | PRN
  Administered 2022-12-11: 250 mL

## 2022-12-11 MED ORDER — LIDOCAINE HCL 3.5 % OP GEL
1.0000 | Freq: Once | OPHTHALMIC | Status: AC
  Administered 2022-12-11: 1 via OPHTHALMIC

## 2022-12-11 SURGICAL SUPPLY — 14 items
CATARACT SUITE SIGHTPATH (MISCELLANEOUS) ×1 IMPLANT
CLOTH BEACON ORANGE TIMEOUT ST (SAFETY) ×1 IMPLANT
EYE SHIELD UNIVERSAL CLEAR (GAUZE/BANDAGES/DRESSINGS) IMPLANT
FEE CATARACT SUITE SIGHTPATH (MISCELLANEOUS) ×1 IMPLANT
GLOVE BIOGEL PI IND STRL 7.0 (GLOVE) ×2 IMPLANT
LENS CLAREON VIVITY TORIC ×1 IMPLANT
LENS IOL CLRN VT TRC 3 22.5 IMPLANT
NDL HYPO 18GX1.5 BLUNT FILL (NEEDLE) ×1 IMPLANT
NEEDLE HYPO 18GX1.5 BLUNT FILL (NEEDLE) ×1 IMPLANT
PAD ARMBOARD 7.5X6 YLW CONV (MISCELLANEOUS) ×1 IMPLANT
RING MALYGIN 7.0 (MISCELLANEOUS) IMPLANT
SYR TB 1ML LL NO SAFETY (SYRINGE) ×1 IMPLANT
TAPE SURG TRANSPORE 1 IN (GAUZE/BANDAGES/DRESSINGS) IMPLANT
WATER STERILE IRR 250ML POUR (IV SOLUTION) ×1 IMPLANT

## 2022-12-11 NOTE — Transfer of Care (Signed)
Immediate Anesthesia Transfer of Care Note  Patient: Antonio Holmes  Procedure(s) Performed: CATARACT EXTRACTION PHACO AND INTRAOCULAR LENS PLACEMENT (IOC) (Left: Eye)  Patient Location: Short Stay  Anesthesia Type:MAC  Level of Consciousness: awake, alert , and oriented  Airway & Oxygen Therapy: Patient Spontanous Breathing  Post-op Assessment: Report given to RN, Post -op Vital signs reviewed and stable, Patient moving all extremities X 4, and Patient able to stick tongue midline  Post vital signs: Reviewed  Last Vitals:  Vitals Value Taken Time  BP 112/81 12/11/22 1136  Temp 36.7 C 12/11/22 1136  Pulse 70 12/11/22 1136  Resp 12 12/11/22 1136  SpO2 97 % 12/11/22 1136    Last Pain:  Vitals:   12/11/22 1023  TempSrc: Oral  PainSc: 0-No pain      Patients Stated Pain Goal: 8 (12/11/22 1023)  Complications: No notable events documented.

## 2022-12-11 NOTE — Interval H&P Note (Signed)
History and Physical Interval Note:  12/11/2022 11:08 AM  Antonio Holmes  has presented today for surgery, with the diagnosis of nuclear sclerosis age related cataract; LEFT.  The various methods of treatment have been discussed with the patient and family. After consideration of risks, benefits and other options for treatment, the patient has consented to  Procedure(s): CATARACT EXTRACTION PHACO AND INTRAOCULAR LENS PLACEMENT (IOC) (Left) as a surgical intervention.  The patient's history has been reviewed, patient examined, no change in status, stable for surgery.  I have reviewed the patient's chart and labs.  Questions were answered to the patient's satisfaction.     Fabio Pierce

## 2022-12-11 NOTE — Discharge Instructions (Signed)
Please discharge patient when stable, will follow up today with Dr. Renate Danh at the [facility] [place] office immediately following discharge.  Leave shield in place until visit.  All paperwork with discharge instructions will be given at the office.  [facility] Address:  [address]  [place],  [address]  

## 2022-12-11 NOTE — Anesthesia Postprocedure Evaluation (Signed)
Anesthesia Post Note  Patient: Antonio Holmes  Procedure(s) Performed: CATARACT EXTRACTION PHACO AND INTRAOCULAR LENS PLACEMENT (IOC) (Left: Eye)  Patient location during evaluation: Phase II Anesthesia Type: MAC Level of consciousness: awake and alert and oriented Pain management: pain level controlled Vital Signs Assessment: post-procedure vital signs reviewed and stable Respiratory status: spontaneous breathing, nonlabored ventilation and respiratory function stable Cardiovascular status: stable and blood pressure returned to baseline Postop Assessment: no apparent nausea or vomiting Anesthetic complications: no  No notable events documented.   Last Vitals:  Vitals:   12/11/22 1023 12/11/22 1136  BP: (!) 143/83 112/81  Pulse:  70  Resp: 10 12  Temp: 36.5 C 36.7 C  SpO2: 99% 97%    Last Pain:  Vitals:   12/11/22 1140  TempSrc:   PainSc: 0-No pain                 Jenniah Bhavsar C Berel Najjar

## 2022-12-11 NOTE — Op Note (Signed)
Date of procedure: 12/11/22  Pre-operative diagnosis:  1.Visually significant age-related cataract  (H25.12)  2. Regular astigmatism, left eye 3. Left Eye; Poor dilation, Left eye  Post-operative diagnosis: Visually significant age-related cataract, Left Eye; Intra-operative Floppy Iris Syndrome, Left Eye (H21.81)  Procedure: Complex removal of cataract via phacoemulsification and insertion of intra-ocular lens Alcon Vivity Toric CNWET3 +22.5D into the capsular bag of the Left Eye (CPT [phone])  Attending surgeon: Rudy Jew. Colleena Kurtenbach, MD, MA  Anesthesia: MAC, Topical Akten  Complications: None  Estimated Blood Loss: <76mL (minimal)  Specimens: None  Implants: As above  Indications:  Visually significant cataract, Left Eye  Procedure:  The patient was seen and identified in the pre-operative area. The operative eye was identified and dilated.  The operative eye was marked at 0 and 180 degrees.  Topical anesthesia was administered to the operative eye.     The patient was then to the operative suite and placed in the supine position.  A timeout was performed confirming the patient, procedure to be performed, and all other relevant information.   The patient's face was prepped and draped in the usual fashion for intra-ocular surgery.  A lid speculum was placed into the operative eye and the surgical microscope moved into place and focused.  Poor dilation of the iris was confirmed.  An inferotemporal paracentesis was created using a 20 gauge paracentesis blade.  Shugarcaine was injected into the anterior chamber.  Viscoelastic was injected into the anterior chamber.  A temporal clear-corneal main wound incision was created using a 2.45mm microkeratome.  A Malyugin ring was placed.  A continuous curvilinear capsulorrhexis was initiated using an irrigating cystitome and completed using capsulorrhexis forceps.  Hydrodissection and hydrodeliniation were performed.  Viscoelastic was injected into the  anterior chamber.  A phacoemulsification handpiece and a chopper as a second instrument were used to remove the nucleus and epinucleus. The irrigation/aspiration handpiece was used to remove any remaining cortical material.   The capsular bag was reinflated with viscoelastic, checked, and found to be intact.  The intraocular lens was inserted into the capsular bag and dialed into place using a Malyugin Ring Manipulator to 15 degrees.. The Malyugin ring was removed.  The irrigation/aspiration handpiece was used to remove any remaining viscoelastic.  The clear corneal wound and paracentesis wounds were then hydrated and checked with Weck-Cels to be watertight. Maxitrol drops were instilled into the operative eye. The lid-speculum and drape was removed, and the patient's face was cleaned with a wet and dry 4x4. A clear shield was taped over the eye. The patient was taken to the post-operative care unit in good condition, having tolerated the procedure well.  Post-Op Instructions: The patient will follow up at [facility] for a same day post-operative evaluation and will receive all other orders and instructions.

## 2022-12-11 NOTE — Anesthesia Preprocedure Evaluation (Addendum)
Anesthesia Evaluation  Patient identified by MRN, date of birth, ID band Patient awake    Reviewed: Allergy & Precautions, H&P , NPO status , Patient's Chart, lab work & pertinent test results  Airway Mallampati: III  TM Distance: >3 FB Neck ROM: Full    Dental  (+) Dental Advisory Given, Caps   Pulmonary asthma , neg sleep apnea (snoring), former smoker   Pulmonary exam normal breath sounds clear to auscultation       Cardiovascular negative cardio ROS Normal cardiovascular exam Rhythm:Regular Rate:Normal     Neuro/Psych negative neurological ROS  negative psych ROS   GI/Hepatic Neg liver ROS,GERD  Medicated and Controlled,,  Endo/Other  negative endocrine ROS    Renal/GU negative Renal ROS  negative genitourinary   Musculoskeletal negative musculoskeletal ROS (+)    Abdominal   Peds negative pediatric ROS (+)  Hematology negative hematology ROS (+)   Anesthesia Other Findings   Reproductive/Obstetrics negative OB ROS                             Anesthesia Physical Anesthesia Plan  ASA: 2  Anesthesia Plan: MAC   Post-op Pain Management: Minimal or no pain anticipated   Induction: Intravenous  PONV Risk Score and Plan: Treatment may vary due to age or medical condition  Airway Management Planned: Nasal Cannula and Natural Airway  Additional Equipment:   Intra-op Plan:   Post-operative Plan:   Informed Consent: I have reviewed the patients History and Physical, chart, labs and discussed the procedure including the risks, benefits and alternatives for the proposed anesthesia with the patient or authorized representative who has indicated his/her understanding and acceptance.     Dental advisory given  Plan Discussed with: CRNA and Surgeon  Anesthesia Plan Comments:        Anesthesia Quick Evaluation

## 2022-12-17 ENCOUNTER — Encounter (HOSPITAL_COMMUNITY): Payer: Self-pay | Admitting: Ophthalmology

## 2022-12-21 ENCOUNTER — Encounter (HOSPITAL_COMMUNITY)
Admission: RE | Admit: 2022-12-21 | Discharge: 2022-12-21 | Disposition: A | Discharge status: Home / Self Care | Payer: BC Managed Care – PPO | Source: Ambulatory Visit | Attending: Ophthalmology | Admitting: Ophthalmology

## 2022-12-21 ENCOUNTER — Encounter (HOSPITAL_COMMUNITY): Payer: Self-pay

## 2022-12-23 NOTE — H&P (Signed)
Surgical History & Physical  Patient Name: Antonio Holmes DOB: August 03, 1961  Surgery: Cataract extraction with intraocular lens implant phacoemulsification; Right Eye  Surgeon: Fabio Pierce MD Surgery Date:  12-25-22 Pre-Op Date:  12-17-22  HPI: A 77 Yr. old male patient present for 6 day post op, Vivity lens OS. Patient is doing well. Vision is improving. Distance vision is good, depending on the size of writing patient can see to read. Patient also presents for blurred vision in the right eye. Difficulties driving due to halos and starburst around headlights. This is negatively affecting the patient's quality of life and the patient is unable to function adequately in life with the current level of vision. Patient would like to proceed with cataract sx. HPI was performed by Fabio Pierce .  Medical History: Cataracts Arthritis Lung Problems  Review of Systems Respiratory Asthma All recorded systems are negative except as noted above.  Social   Former smoker   Medication Prednisolone-Moxifloxacin-Bromfenac,  Symbicort Chief Executive Officer, Flonase, Singulair, Xyzal, Albuterol,   Sx/Procedures Phaco c IOL OS,  Knee sx, Fatty benign tumor removal right leg,   Drug Allergies  Tramadol,   History & Physical: Heent: cataract, right eye NECK: supple without bruits LUNGS: lungs clear to auscultation CV: regular rate and rhythm Abdomen: soft and non-tender Impression & Plan: Assessment: 1.  CATARACT EXTRACTION STATUS; Left Eye (Z98.42) 2.  INTRAOCULAR LENS IOL ; Left Eye (Z96.1) 3.  NUCLEAR SCLEROSIS AGE RELATED; Right Eye (H25.11) 4.  Myopia ; Left Eye (H52.12) 5.  ASTIGMATISM, REGULAR; Both Eyes (H52.223)  Plan: 1.  1 week after cataract surgery. Doing well with improved vision and normal eye pressure. Call with any problems or concerns. Continue Pred-Moxi-Brom 2x/day for 3 more weeks.  2.  Doing well since surgery Continue Post-op medications  3.  Cataract accounts for  the patient's decreased vision. This visual impairment is not correctable with a tolerable change in glasses or contact lenses. Cataract surgery with an implantation of a new lens should significantly improve the visual and functional status of the patient. Discussed all risks, benefits, alternatives, and potential complications. Discussed the procedures and recovery. Patient desires to have surgery. A-scan ordered and performed today for intra-ocular lens calculations. The surgery will be performed in order to improve vision for driving, reading, and for eye examinations. Recommend phacoemulsification with intra-ocular lens. Recommend Dextenza for post-operative pain and inflammation. Right Eye. Surgery required to correct imbalance of vision. Dilates poorly - shugarcaine by protocol. Malyugin Ring. Omidira. Vivity Lens.  4.   5.  Please run on toric calculator OD to see what the recommend IOL is.

## 2022-12-25 ENCOUNTER — Ambulatory Visit (HOSPITAL_COMMUNITY): Payer: BC Managed Care – PPO | Admitting: Anesthesiology

## 2022-12-25 ENCOUNTER — Encounter (HOSPITAL_COMMUNITY): Payer: Self-pay | Admitting: Ophthalmology

## 2022-12-25 ENCOUNTER — Ambulatory Visit (HOSPITAL_COMMUNITY)
Admission: RE | Admit: 2022-12-25 | Discharge: 2022-12-25 | Disposition: A | Discharge status: Home / Self Care | Payer: BC Managed Care – PPO | Source: Ambulatory Visit | Attending: Ophthalmology | Admitting: Ophthalmology

## 2022-12-25 ENCOUNTER — Encounter (HOSPITAL_COMMUNITY)
Admission: RE | Disposition: A | Discharge status: Home / Self Care | Payer: Self-pay | Source: Ambulatory Visit | Attending: Ophthalmology

## 2022-12-25 DIAGNOSIS — H2511 Age-related nuclear cataract, right eye: Secondary | ICD-10-CM | POA: Diagnosis not present

## 2022-12-25 DIAGNOSIS — H52223 Regular astigmatism, bilateral: Secondary | ICD-10-CM | POA: Diagnosis not present

## 2022-12-25 DIAGNOSIS — H2181 Floppy iris syndrome: Secondary | ICD-10-CM | POA: Diagnosis not present

## 2022-12-25 DIAGNOSIS — Z961 Presence of intraocular lens: Secondary | ICD-10-CM | POA: Insufficient documentation

## 2022-12-25 DIAGNOSIS — Z9842 Cataract extraction status, left eye: Secondary | ICD-10-CM | POA: Diagnosis not present

## 2022-12-25 DIAGNOSIS — Z87891 Personal history of nicotine dependence: Secondary | ICD-10-CM | POA: Insufficient documentation

## 2022-12-25 DIAGNOSIS — J45909 Unspecified asthma, uncomplicated: Secondary | ICD-10-CM | POA: Diagnosis not present

## 2022-12-25 DIAGNOSIS — H5212 Myopia, left eye: Secondary | ICD-10-CM | POA: Insufficient documentation

## 2022-12-25 DIAGNOSIS — K219 Gastro-esophageal reflux disease without esophagitis: Secondary | ICD-10-CM | POA: Diagnosis not present

## 2022-12-25 HISTORY — PX: CATARACT EXTRACTION W/PHACO: SHX586

## 2022-12-25 SURGERY — PHACOEMULSIFICATION, CATARACT, WITH IOL INSERTION
Anesthesia: Monitor Anesthesia Care | Site: Eye | Laterality: Right

## 2022-12-25 MED ORDER — STERILE WATER FOR IRRIGATION IR SOLN
Status: DC | PRN
  Administered 2022-12-25: 250 mL

## 2022-12-25 MED ORDER — PHENYLEPHRINE HCL 2.5 % OP SOLN
1.0000 [drp] | OPHTHALMIC | Status: AC | PRN
  Administered 2022-12-25 (×3): 1 [drp] via OPHTHALMIC

## 2022-12-25 MED ORDER — SODIUM HYALURONATE 23MG/ML IO SOSY
PREFILLED_SYRINGE | INTRAOCULAR | Status: DC | PRN
  Administered 2022-12-25: .6 mL via INTRAOCULAR

## 2022-12-25 MED ORDER — NEOMYCIN-POLYMYXIN-DEXAMETH 3.5-10000-0.1 OP SUSP
OPHTHALMIC | Status: DC | PRN
  Administered 2022-12-25: 2 [drp] via OPHTHALMIC

## 2022-12-25 MED ORDER — MIDAZOLAM HCL 5 MG/5ML IJ SOLN
INTRAMUSCULAR | Status: DC | PRN
  Administered 2022-12-25: 1 mg via INTRAVENOUS

## 2022-12-25 MED ORDER — MIDAZOLAM HCL 2 MG/2ML IJ SOLN
INTRAMUSCULAR | Status: AC
  Filled 2022-12-25: qty 2

## 2022-12-25 MED ORDER — TETRACAINE HCL 0.5 % OP SOLN
1.0000 [drp] | OPHTHALMIC | Status: AC | PRN
  Administered 2022-12-25 (×3): 1 [drp] via OPHTHALMIC

## 2022-12-25 MED ORDER — PHENYLEPHRINE-KETOROLAC 1-0.3 % IO SOLN
INTRAOCULAR | Status: AC
  Filled 2022-12-25: qty 4

## 2022-12-25 MED ORDER — PHENYLEPHRINE-KETOROLAC 1-0.3 % IO SOLN
INTRAOCULAR | Status: DC | PRN
  Administered 2022-12-25: 500 mL via OPHTHALMIC

## 2022-12-25 MED ORDER — SODIUM CHLORIDE 0.9% FLUSH
INTRAVENOUS | Status: DC | PRN
  Administered 2022-12-25: 10 mL via INTRAVENOUS

## 2022-12-25 MED ORDER — LIDOCAINE HCL (PF) 1 % IJ SOLN
INTRAOCULAR | Status: DC | PRN
  Administered 2022-12-25: 1 mL via OPHTHALMIC

## 2022-12-25 MED ORDER — FENTANYL CITRATE (PF) 100 MCG/2ML IJ SOLN
INTRAMUSCULAR | Status: AC
  Filled 2022-12-25: qty 2

## 2022-12-25 MED ORDER — BSS IO SOLN
INTRAOCULAR | Status: DC | PRN
  Administered 2022-12-25: 15 mL via INTRAOCULAR

## 2022-12-25 MED ORDER — FENTANYL CITRATE (PF) 100 MCG/2ML IJ SOLN
INTRAMUSCULAR | Status: DC | PRN
  Administered 2022-12-25: 50 ug via INTRAVENOUS

## 2022-12-25 MED ORDER — SODIUM HYALURONATE 10 MG/ML IO SOLUTION
PREFILLED_SYRINGE | INTRAOCULAR | Status: DC | PRN
  Administered 2022-12-25: .85 mL via INTRAOCULAR

## 2022-12-25 MED ORDER — EPINEPHRINE PF 1 MG/ML IJ SOLN
INTRAMUSCULAR | Status: AC
  Filled 2022-12-25: qty 1

## 2022-12-25 MED ORDER — TROPICAMIDE 1 % OP SOLN
1.0000 [drp] | OPHTHALMIC | Status: AC | PRN
  Administered 2022-12-25 (×3): 1 [drp] via OPHTHALMIC

## 2022-12-25 MED ORDER — LIDOCAINE HCL 3.5 % OP GEL
1.0000 | Freq: Once | OPHTHALMIC | Status: AC
  Administered 2022-12-25: 1 via OPHTHALMIC

## 2022-12-25 MED ORDER — POVIDONE-IODINE 5 % OP SOLN
OPHTHALMIC | Status: DC | PRN
  Administered 2022-12-25: 1 via OPHTHALMIC

## 2022-12-25 SURGICAL SUPPLY — 17 items
1x MONARCH III D IMPLANT
CATARACT SUITE SIGHTPATH (MISCELLANEOUS) ×1 IMPLANT
CLOTH BEACON ORANGE TIMEOUT ST (SAFETY) ×1 IMPLANT
EYE SHIELD UNIVERSAL CLEAR (GAUZE/BANDAGES/DRESSINGS) IMPLANT
FEE CATARACT SUITE SIGHTPATH (MISCELLANEOUS) ×1 IMPLANT
GLOVE BIOGEL PI IND STRL 7.0 (GLOVE) ×2 IMPLANT
GLOVE SURG SS PI 6.5 STRL IVOR (GLOVE) IMPLANT
LENS CLAREON VIVITY 21.5 ×1 IMPLANT
LENS IOL CLRN VT YLW 21.5 IMPLANT
NDL HYPO 18GX1.5 BLUNT FILL (NEEDLE) ×1 IMPLANT
NEEDLE HYPO 18GX1.5 BLUNT FILL (NEEDLE) ×1 IMPLANT
PAD ARMBOARD 7.5X6 YLW CONV (MISCELLANEOUS) ×1 IMPLANT
RING MALYGIN 7.0 (MISCELLANEOUS) IMPLANT
SIGHTPATH CAT PROC W REG LENS (Ophthalmic Related) IMPLANT
SYR TB 1ML LL NO SAFETY (SYRINGE) ×1 IMPLANT
TAPE SURG TRANSPORE 1 IN (GAUZE/BANDAGES/DRESSINGS) IMPLANT
WATER STERILE IRR 250ML POUR (IV SOLUTION) ×1 IMPLANT

## 2022-12-25 NOTE — Transfer of Care (Signed)
Immediate Anesthesia Transfer of Care Note  Patient: Antonio Holmes  Procedure(s) Performed: CATARACT EXTRACTION PHACO AND INTRAOCULAR LENS PLACEMENT (IOC) (Right: Eye)  Patient Location: Short Stay  Anesthesia Type:MAC  Level of Consciousness: awake, alert , and oriented  Airway & Oxygen Therapy: Patient Spontanous Breathing  Post-op Assessment: Report given to RN, Post -op Vital signs reviewed and stable, Patient moving all extremities X 4, and Patient able to stick tongue midline  Post vital signs: Reviewed  Last Vitals:  Vitals Value Taken Time  BP 120/74   Temp 97.9   Pulse 74   Resp 12   SpO2 96     Last Pain:  Vitals:   12/25/22 0945  TempSrc: Oral  PainSc: 0-No pain         Complications: No notable events documented.

## 2022-12-25 NOTE — Anesthesia Preprocedure Evaluation (Signed)
Anesthesia Evaluation  Patient identified by MRN, date of birth, ID band Patient awake    Reviewed: Allergy & Precautions, H&P , NPO status , Patient's Chart, lab work & pertinent test results  Airway Mallampati: III  TM Distance: >3 FB Neck ROM: Full    Dental  (+) Dental Advisory Given, Caps   Pulmonary asthma , neg sleep apnea (snoring), former smoker   Pulmonary exam normal breath sounds clear to auscultation       Cardiovascular negative cardio ROS Normal cardiovascular exam Rhythm:Regular Rate:Normal     Neuro/Psych negative neurological ROS  negative psych ROS   GI/Hepatic Neg liver ROS,GERD  Medicated and Controlled,,  Endo/Other  negative endocrine ROS    Renal/GU negative Renal ROS  negative genitourinary   Musculoskeletal negative musculoskeletal ROS (+)    Abdominal   Peds negative pediatric ROS (+)  Hematology negative hematology ROS (+)   Anesthesia Other Findings   Reproductive/Obstetrics negative OB ROS                             Anesthesia Physical Anesthesia Plan  ASA: 2  Anesthesia Plan: MAC   Post-op Pain Management: Minimal or no pain anticipated   Induction: Intravenous  PONV Risk Score and Plan: Treatment may vary due to age or medical condition  Airway Management Planned: Nasal Cannula and Natural Airway  Additional Equipment:   Intra-op Plan:   Post-operative Plan:   Informed Consent: I have reviewed the patients History and Physical, chart, labs and discussed the procedure including the risks, benefits and alternatives for the proposed anesthesia with the patient or authorized representative who has indicated his/her understanding and acceptance.     Dental advisory given  Plan Discussed with: CRNA and Surgeon  Anesthesia Plan Comments:        Anesthesia Quick Evaluation  

## 2022-12-25 NOTE — Discharge Instructions (Signed)
Please discharge patient when stable, will follow up today with Dr. Lyfe Reihl at the [facility] [place] office immediately following discharge.  Leave shield in place until visit.  All paperwork with discharge instructions will be given at the office.  [facility] Address:  [address]  [place],  [address]  

## 2022-12-25 NOTE — Op Note (Signed)
Date of procedure: 12/25/22  Pre-operative diagnosis: Visually significant age-related cataract, Right Eye; Poor Dilation, Right Eye (H25.?1)  Post-operative diagnosis: Visually significant age-related cataract, Right Eye; Intra-operative Floppy Iris Syndrome, Right Eye (H21.81)  Procedure: Removal of cataract via phacoemulsification and insertion of intra-ocular lens Alcon Vivity CNWET0 +21.5D into the capsular bag of the Right Eye (CPT [phone])  Attending surgeon: Rudy Jew. Isbella Arline, MD, MA  Anesthesia: MAC, Topical Akten  Complications: None  Estimated Blood Loss: <38mL (minimal)  Specimens: None  Implants: As above  Indications:  Visually significant cataract, Right Eye  Procedure:  The patient was seen and identified in the pre-operative area. The operative eye was identified and dilated.  The operative eye was marked.  Topical anesthesia was administered to the operative eye.     The patient was then to the operative suite and placed in the supine position.  A timeout was performed confirming the patient, procedure to be performed, and all other relevant information.   The patient's face was prepped and draped in the usual fashion for intra-ocular surgery.  A lid speculum was placed into the operative eye and the surgical microscope moved into place and focused.  Poor dilation of the iris was confirmed.  A superotemporal paracentesis was created using a 20 gauge paracentesis blade.  Shugarcaine was injected into the anterior chamber.  Viscoelastic was injected into the anterior chamber.  A temporal clear-corneal main wound incision was created using a 2.61mm microkeratome.  A Malyugin ring was placed.  A continuous curvilinear capsulorrhexis was initiated using an irrigating cystitome and completed using capsulorrhexis forceps.  Hydrodissection and hydrodeliniation were performed.  Viscoelastic was injected into the anterior chamber.  A phacoemulsification handpiece and a chopper as a second  instrument were used to remove the nucleus and epinucleus. The irrigation/aspiration handpiece was used to remove any remaining cortical material.   The capsular bag was reinflated with viscoelastic, checked, and found to be intact.  The intraocular lens was inserted into the capsular bag and dialed into place using a Customer service manager.  The Malyugin ring was removed.  The irrigation/aspiration handpiece was used to remove any remaining viscoelastic.  The clear corneal wound and paracentesis wounds were then hydrated and checked with Weck-Cels to be watertight. Maxitrol drops were instilled into the operative eye.  The lid-speculum and drape was removed, and the patient's face was cleaned with a wet and dry 4x4. A clear shield was taped over the eye. The patient was taken to the post-operative care unit in good condition, having tolerated the procedure well.  Post-Op Instructions: The patient will follow up at [facility] for a same day post-operative evaluation and will receive all other orders and instructions.

## 2022-12-25 NOTE — Interval H&P Note (Signed)
History and Physical Interval Note:  12/25/2022 10:17 AM  Antonio Holmes  has presented today for surgery, with the diagnosis of nuclear sclerosis age related cataract; right.  The various methods of treatment have been discussed with the patient and family. After consideration of risks, benefits and other options for treatment, the patient has consented to  Procedure(s) with comments: CATARACT EXTRACTION PHACO AND INTRAOCULAR LENS PLACEMENT (IOC) (Right) - CDE: as a surgical intervention.  The patient's history has been reviewed, patient examined, no change in status, stable for surgery.  I have reviewed the patient's chart and labs.  Questions were answered to the patient's satisfaction.     Fabio Pierce

## 2022-12-27 NOTE — Anesthesia Postprocedure Evaluation (Signed)
Anesthesia Post Note  Patient: Antonio Holmes  Procedure(s) Performed: CATARACT EXTRACTION PHACO AND INTRAOCULAR LENS PLACEMENT (IOC) (Right: Eye)  Patient location during evaluation: Phase II Anesthesia Type: MAC Level of consciousness: awake Pain management: pain level controlled Vital Signs Assessment: post-procedure vital signs reviewed and stable Respiratory status: spontaneous breathing and respiratory function stable Cardiovascular status: blood pressure returned to baseline and stable Postop Assessment: no headache and no apparent nausea or vomiting Anesthetic complications: no Comments: Late entry   No notable events documented.   Last Vitals:  Vitals:   12/25/22 0945 12/25/22 1044  BP: 126/83 120/74  Pulse: 69 70  Resp: 15 18  Temp: 36.7 C 36.6 C  SpO2: 99% 98%    Last Pain:  Vitals:   12/25/22 1044  TempSrc: Oral  PainSc: 0-No pain                 Windell Norfolk

## 2022-12-31 ENCOUNTER — Encounter (HOSPITAL_COMMUNITY): Payer: Self-pay | Admitting: Ophthalmology

## 2023-03-26 ENCOUNTER — Other Ambulatory Visit: Payer: Self-pay

## 2023-03-26 ENCOUNTER — Ambulatory Visit (INDEPENDENT_AMBULATORY_CARE_PROVIDER_SITE_OTHER): Payer: BC Managed Care – PPO | Admitting: Family Medicine

## 2023-03-26 ENCOUNTER — Encounter: Payer: Self-pay | Admitting: Family Medicine

## 2023-03-26 VITALS — BP 130/70 | HR 85 | Temp 98.3°F | Resp 16 | Ht 64.0 in | Wt 199.5 lb

## 2023-03-26 DIAGNOSIS — J302 Other seasonal allergic rhinitis: Secondary | ICD-10-CM

## 2023-03-26 DIAGNOSIS — J3089 Other allergic rhinitis: Secondary | ICD-10-CM | POA: Diagnosis not present

## 2023-03-26 DIAGNOSIS — K219 Gastro-esophageal reflux disease without esophagitis: Secondary | ICD-10-CM

## 2023-03-26 DIAGNOSIS — J4540 Moderate persistent asthma, uncomplicated: Secondary | ICD-10-CM | POA: Diagnosis not present

## 2023-03-26 MED ORDER — PULMICORT FLEXHALER 90 MCG/ACT IN AEPB: 1.0000 | INHALATION_SPRAY | Freq: Two times a day (BID) | RESPIRATORY_TRACT | 3 refills | Status: DC

## 2023-03-26 MED ORDER — FAMOTIDINE 20 MG PO TABS: 20.0000 mg | ORAL_TABLET | Freq: Two times a day (BID) | ORAL | 5 refills | Status: DC

## 2023-03-26 NOTE — Progress Notes (Signed)
[address] [place] [address] Dept: [phone]  FOLLOW UP NOTE  Patient ID: Antonio Holmes, male    DOB: [birth year]  Age: 62 y.o. MRN: [id number] Date of Office Visit: 03/26/2023  Assessment  Chief Complaint: Follow-up (Asthma has been worse. Has been having coughing fits, sob with physical activity. )  HPI Antonio Holmes is a 62 year old male who presents to the clinic for follow-up visit.  He was last seen in this clinic on 12/10/2021 by Dr. Dellis Anes for evaluation of asthma, reflux, and allergic rhinitis.    At today's visit, he reports his asthma has been moderately well-controlled with symptoms including shortness of breath with activity such as stairs and cough occurring daily which is reported as dry.  He continues to use albuterol between 2 and 3 times a week with moderate relief of symptoms.  He does not use albuterol before activity.  He continues montelukast 10 mg once a day.  Allergic rhinitis is reported as moderately well-controlled with symptoms including occasional nasal congestion and occasional sneezing.  He continues Xyzal 5 mg once a day and occasionally uses Flonase nasal spray. He stopped allergen immunotherapy after 5 years of treatment directed toward grass pollen, weed pollen, tree pollen, mold, dust mite, cat, dog, and cockroach shortly after his last visit on 12/10/2021.  Reflux is reported as well-controlled with no symptoms including heartburn or vomiting.  He is not currently taking a PPI or H2 blocker.  Interestingly, when he experienced respiratory symptoms at a previous visit and started medicine to control reflux, his respiratory symptoms resolved.  His current medications are listed in the chart.  Drug Allergies:  Allergies  Allergen Reactions   Bee Venom Other (See Comments)   Tramadol     "made my skin crawl"   Tramadol Hcl Other (See Comments)    Physical Exam: BP 130/70   Pulse 85   Temp 98.3 F (36.8 C)   Resp 16    Ht 5\' 4"  (1.626 m)   Wt 199 lb 8 oz (90.5 kg)   SpO2 96%   BMI 34.24 kg/m    Physical Exam Vitals reviewed.  Constitutional:      Appearance: Normal appearance.  HENT:     Head: Normocephalic and atraumatic.     Right Ear: Tympanic membrane normal.     Left Ear: Tympanic membrane normal.     Nose:     Comments: Bilateral naris slightly erythematous with thin clear nasal drainage noted.  Pharynx normal.  Ears normal.  Eyes normal.    Mouth/Throat:     Pharynx: Oropharynx is clear.  Eyes:     Conjunctiva/sclera: Conjunctivae normal.  Cardiovascular:     Rate and Rhythm: Normal rate and regular rhythm.     Heart sounds: Normal heart sounds. No murmur heard. Pulmonary:     Effort: Pulmonary effort is normal.     Breath sounds: Normal breath sounds.     Comments: Lungs clear to auscultation Musculoskeletal:        General: Normal range of motion.     Cervical back: Normal range of motion and neck supple.  Skin:    General: Skin is warm and dry.  Neurological:     Mental Status: He is alert and oriented to person, place, and time.  Psychiatric:        Mood and Affect: Mood normal.        Behavior: Behavior normal.        Thought Content:  Thought content normal.        Judgment: Judgment normal.     Diagnostics: FVC 2.86 which is 78% of predicted value, FEV1 1.90 which is 68% of predicted value.  Spirometry indicates normal ventilatory function with reduced FEV1.  Postbronchodilator FVC 2.86, FEV1 1.54.  Spirometry indicates no significant improvement in FEV1.  Assessment and Plan: 1. Not well controlled moderate persistent asthma   2. Seasonal and perennial allergic rhinitis   3. Gastroesophageal reflux disease, unspecified whether esophagitis present     Meds ordered this encounter  Medications   Budesonide (PULMICORT FLEXHALER) 90 MCG/ACT inhaler    Sig: Inhale 1 puff into the lungs 2 (two) times daily.    Dispense:  1 each    Refill:  3   famotidine (PEPCID) 20  MG tablet    Sig: Take 1 tablet (20 mg total) by mouth 2 (two) times daily.    Dispense:  60 tablet    Refill:  5    Patient Instructions  Allergic rhinitis Continue montelukast 10 mg once a day to control allergy symptoms Continue Flonase 2 sprays in each nostril once a day as needed for runny nose Consider saline nasal rinses as needed for nasal symptoms. Use this before any medicated nasal sprays for best result  Asthma Continue albuterol 2 puffs once every 4 hours as needed for cough or wheeze You may use albuterol 2 puffs 5 to 15 minutes before activity to decrease cough or wheeze Begin Pulmicort 90-1 puff twice a day to prevent cough or wheeze  Reflux Continue dietary and lifestyle modifications as listed below Begin famotidine 20 mg twice a day to prevent reflux  Call the clinic if this treatment plan is not working well for you.  Follow up in 3 months or sooner if needed.  Return in about 3 months (around 06/26/2023), or if symptoms worsen or fail to improve.    Thank you for the opportunity to care for this patient.  Please do not hesitate to contact me with questions.  Thermon Leyland, FNP [facility]

## 2023-03-26 NOTE — Patient Instructions (Signed)
Allergic rhinitis Continue montelukast 10 mg once a day to control allergy symptoms Continue Flonase 2 sprays in each nostril once a day as needed for runny nose Consider saline nasal rinses as needed for nasal symptoms. Use this before any medicated nasal sprays for best result  Asthma Continue albuterol 2 puffs once every 4 hours as needed for cough or wheeze You may use albuterol 2 puffs 5 to 15 minutes before activity to decrease cough or wheeze Begin Pulmicort 90-1 puff twice a day to prevent cough or wheeze  Reflux Continue dietary and lifestyle modifications as listed below Begin famotidine 20 mg twice a day to prevent reflux  Call the clinic if this treatment plan is not working well for you.  Follow up in 3 months or sooner if needed.

## 2023-03-29 NOTE — Addendum Note (Signed)
Addended by: Elsworth Soho on: 03/29/2023 10:24 AM   Modules accepted: Orders

## 2023-07-06 NOTE — Patient Instructions (Incomplete)
Allergic rhinitis Continue montelukast 10 mg once a day to control allergy symptoms Continue Flonase 2 sprays in each nostril once a day as needed for runny nose Consider saline nasal rinses as needed for nasal symptoms. Use this before any medicated nasal sprays for best result  Asthma Continue albuterol 2 puffs once every 4 hours as needed for cough or wheeze You may use albuterol 2 puffs 5 to 15 minutes before activity to decrease cough or wheeze Begin Pulmicort 90-1 puff twice a day to prevent cough or wheeze  Reflux Continue dietary and lifestyle modifications as listed below Begin famotidine 20 mg twice a day to prevent reflux  Call the clinic if this treatment plan is not working well for you.  Follow up in 3 months or sooner if needed.

## 2023-07-06 NOTE — Progress Notes (Unsigned)
   [address] [place] [address] Dept: [phone]  FOLLOW UP NOTE  Patient ID: Antonio Holmes, male    DOB: 08-Oct-1960  Age: 62 y.o. MRN: [id number] Date of Office Visit: 07/07/2023  Assessment  Chief Complaint: No chief complaint on file.  HPI Antonio Holmes is a 62 year old male who presents to the clinic for a follow up visit. He was last seen in the clinic on 03/26/2023 by Thermon Leyland, FNP, for evaluation of asthma, allergic rhinitis, and reflux. His last environmental allergy skin testing was on 02/17/2016 and was positive to grass pollen, weed pollen, ragweed pollen, tree pollen, mold, dust mite, cat, dog, and cockroach.  Discussed the use of AI scribe software for clinical note transcription with the patient, who gave verbal consent to proceed.  History of Present Illness             Drug Allergies:  Allergies  Allergen Reactions   Bee Venom Other (See Comments)   Tramadol     "made my skin crawl"   Tramadol Hcl Other (See Comments)    Physical Exam: There were no vitals taken for this visit.   Physical Exam  Diagnostics:    Assessment and Plan: No diagnosis found.  No orders of the defined types were placed in this encounter.   There are no Patient Instructions on file for this visit.  No follow-ups on file.    Thank you for the opportunity to care for this patient.  Please do not hesitate to contact me with questions.  Thermon Leyland, FNP [facility]

## 2023-07-07 ENCOUNTER — Ambulatory Visit (INDEPENDENT_AMBULATORY_CARE_PROVIDER_SITE_OTHER): Payer: BC Managed Care – PPO | Admitting: Family Medicine

## 2023-07-07 ENCOUNTER — Encounter: Payer: Self-pay | Admitting: Family Medicine

## 2023-07-07 VITALS — BP 110/78 | HR 78 | Temp 97.7°F | Resp 16 | Wt 196.1 lb

## 2023-07-07 DIAGNOSIS — K219 Gastro-esophageal reflux disease without esophagitis: Secondary | ICD-10-CM

## 2023-07-07 DIAGNOSIS — Z91038 Other insect allergy status: Secondary | ICD-10-CM | POA: Diagnosis not present

## 2023-07-07 DIAGNOSIS — J3089 Other allergic rhinitis: Secondary | ICD-10-CM

## 2023-07-07 DIAGNOSIS — J4540 Moderate persistent asthma, uncomplicated: Secondary | ICD-10-CM

## 2023-07-07 DIAGNOSIS — J302 Other seasonal allergic rhinitis: Secondary | ICD-10-CM

## 2023-07-07 MED ORDER — EPINEPHRINE 0.3 MG/0.3ML IJ SOAJ: 0.3000 mg | INTRAMUSCULAR | 1 refills | Status: AC

## 2023-07-07 MED ORDER — PULMICORT FLEXHALER 90 MCG/ACT IN AEPB: 2.0000 | INHALATION_SPRAY | Freq: Two times a day (BID) | RESPIRATORY_TRACT | 5 refills | Status: DC

## 2023-07-15 ENCOUNTER — Telehealth: Payer: Self-pay

## 2023-07-15 NOTE — Telephone Encounter (Signed)
Received fax from [facility] - Preoperative Risk Assessment - DOB verified.  Form has been placed in provider's in basket to review/complete/sign.  Forwarding message as update.

## 2023-07-20 NOTE — Telephone Encounter (Signed)
Signed and returned to nursing staff.   Malachi Bonds, MD [facility]

## 2023-07-20 NOTE — Telephone Encounter (Signed)
Signed Preoperative Risk Assessment Form and last office note have been faxed to [facility] Attn: Silvestre Mesi [phone].

## 2023-10-04 ENCOUNTER — Ambulatory Visit: Payer: 59 | Attending: Orthopedic Surgery | Admitting: Physical Therapy

## 2023-10-04 ENCOUNTER — Encounter: Payer: Self-pay | Admitting: Physical Therapy

## 2023-10-04 ENCOUNTER — Other Ambulatory Visit: Payer: Self-pay

## 2023-10-04 DIAGNOSIS — M25562 Pain in left knee: Secondary | ICD-10-CM | POA: Diagnosis present

## 2023-10-04 DIAGNOSIS — M25662 Stiffness of left knee, not elsewhere classified: Secondary | ICD-10-CM

## 2023-10-04 DIAGNOSIS — R600 Localized edema: Secondary | ICD-10-CM

## 2023-10-04 DIAGNOSIS — G8929 Other chronic pain: Secondary | ICD-10-CM

## 2023-10-04 NOTE — Therapy (Signed)
 OUTPATIENT PHYSICAL THERAPY LOWER EXTREMITY EVALUATION   Patient Name: Antonio Holmes MRN: [id number] DOB:July 19, 1961, 63 y.o., male Today's Date: 10/04/2023  END OF SESSION:  PT End of Session - 10/04/23 0953     Visit Number 1    Number of Visits 12    Date for PT Re-Evaluation 11/15/23    PT Start Time 0926    PT Stop Time 1008    PT Time Calculation (min) 42 min    Activity Tolerance Patient tolerated treatment well    Behavior During Therapy [facility] for tasks assessed/performed             Past Medical History:  Diagnosis Date   Asthma    Cough    Past Surgical History:  Procedure Laterality Date   CATARACT EXTRACTION W/PHACO Left 12/11/2022   Procedure: CATARACT EXTRACTION PHACO AND INTRAOCULAR LENS PLACEMENT (IOC);  Surgeon: Tarri Farm, MD;  Location: [facility];  Service: Ophthalmology;  Laterality: Left;  CDE:9.97   CATARACT EXTRACTION W/PHACO Right 12/25/2022   Procedure: CATARACT EXTRACTION PHACO AND INTRAOCULAR LENS PLACEMENT (IOC);  Surgeon: Tarri Farm, MD;  Location: [facility];  Service: Ophthalmology;  Laterality: Right;  CDE: 9.83   KNEE SURGERY     WISDOM TOOTH EXTRACTION     Patient Active Problem List   Diagnosis Date Noted   Gastroesophageal reflux disease 03/26/2023   Rash and other nonspecific skin eruption 08/13/2020   Left knee pain 12/05/2019   Allergic conjunctivitis 02/28/2019   Asthma with acute exacerbation 06/22/2016   Seasonal and perennial allergic rhinitis 02/17/2016   History of esophageal reflux 02/17/2016   Cough, persistent 02/17/2016   Not well controlled moderate persistent asthma 09/20/2013    REFERRING PROVIDER: Osa Blase MD  REFERRING DIAG: S/p left partial knee replacement.  THERAPY DIAG:  Chronic pain of left knee  Localized edema  Stiffness of left knee, not elsewhere classified  Rationale for Evaluation and Treatment: Rehabilitation  ONSET DATE: 09/30/23 (surgery date).  SUBJECTIVE:   SUBJECTIVE  STATEMENT: The patient presents to the clinic s/p left partial knee replacement performed on 09/30/23.  She is very pleased with his surgical outcome thus far.  He is compliant to a HEP.  His has TED hose donned over his post-surgical Aquacel dressing.  His pain is mild today.    PERTINENT HISTORY: Unremarkable. PAIN:  Are you having pain? Yes: NPRS scale: Mild. Pain location: Left knee. Pain description: Ache. Aggravating factors: Movement. Relieving factors: Rest.  PRECAUTIONS: Other: No ultrasound.   WEIGHT BEARING RESTRICTIONS: No  FALLS:  Has patient fallen in last 6 months? No  LIVING ENVIRONMENT: Lives with: lives with their spouse Lives in: House/apartment Has following equipment at home: None   PLOF: Independent   OBJECTIVE:  Note: Objective measures were completed at Evaluation unless otherwise noted.   EDEMA:  Circumferential: 4 cms greater on left than right.   PALPATION: No significant areas of tenderness today.  LOWER EXTREMITY ROM:  In supine, left knee extension is -5 degrees and seated flexion is 90 degrees.  LOWER EXTREMITY MMT:  Left hip flexion and and knee extension is a solid 4+/5.  GAIT: Mild stiff-legged gait pattern.  TREATMENT DATE: 10/04/23:  LE elevation and vasopneumatic on low x 15 minutes.      PATIENT EDUCATION:  Education details:  Person educated:  International aid/development worker:  Education comprehension:   HOME EXERCISE PROGRAM:   ASSESSMENT:  CLINICAL IMPRESSION: The patient presents to OPPT s/p left partial knee replacement performed on 09/30/23.  He presented with TED hose donned.  He is walking safely without an assistive device.  He has moderate edema currently.  He has an expected loss of range of motion.  He has bee compliant to a HEP from the hospital.  He is easily able to perform left LE antigravity  movements.  Patient will benefit from skilled physical therapy intervention to address pain and deficits.  OBJECTIVE IMPAIRMENTS: decreased activity tolerance, decreased ROM, decreased strength, increased edema, and pain.   ACTIVITY LIMITATIONS: locomotion level  PARTICIPATION LIMITATIONS: cleaning and yard work  [facility] POTENTIAL: Excellent  CLINICAL DECISION MAKING: Stable/uncomplicated  EVALUATION COMPLEXITY: Low   GOALS:  LONG TERM GOALS: Target date: 11/15/23.  Ind with a HEP.  Goal status: INITIAL  2.  Full active left knee extension. Goal status: INITIAL  3.  Active knee flexion to 120 degrees+ so the patient can perform functional tasks and do so with pain not > 2-3/10.  Goal status: INITIAL  4.  Increase left hip and knee strength to a solid 5/5 to provide good stability for accomplishment of functional activities.  Goal status: INITIAL  5.  Decrease edema to within 2 cms of non-affected side to assist with pain reduction and range of motion gains.  Goal status: INITIAL  6.  Perform a reciprocating stair gait with one railing with pain not > 2-3/10.  Goal status: INITIAL   PLAN:  PT FREQUENCY: 2x/week  PT DURATION: 6 weeks  PLANNED INTERVENTIONS: 97110-Therapeutic exercises, 97530- Therapeutic activity, [id number]- Neuromuscular re-education, 97535- Self Care, 75643- Manual therapy, 97014- Electrical stimulation (unattended), 97016- Vasopneumatic device, Patient/Family education, Stair training, Cryotherapy, and Moist heat  PLAN FOR NEXT SESSION: Nustep, progress to recumbent bike, left LE ROM.  LE elevation and vasopneumatic.     Roshon Duell, [place], PT 10/04/2023, 10:13 AM

## 2023-10-07 ENCOUNTER — Ambulatory Visit: Payer: 59

## 2023-10-07 DIAGNOSIS — M25662 Stiffness of left knee, not elsewhere classified: Secondary | ICD-10-CM

## 2023-10-07 DIAGNOSIS — G8929 Other chronic pain: Secondary | ICD-10-CM

## 2023-10-07 DIAGNOSIS — M25562 Pain in left knee: Secondary | ICD-10-CM | POA: Diagnosis not present

## 2023-10-07 DIAGNOSIS — R600 Localized edema: Secondary | ICD-10-CM

## 2023-10-07 NOTE — Therapy (Signed)
OUTPATIENT PHYSICAL THERAPY LOWER EXTREMITY TREATMENT   Patient Name: Antonio Holmes MRN: [id number] DOB:1960/12/29, 63 y.o., male Today's Date: 10/07/2023  END OF SESSION:  PT End of Session - 10/07/23 0945     Visit Number 2    Number of Visits 12    Date for PT Re-Evaluation 11/15/23    PT Start Time 0930    PT Stop Time 1022    PT Time Calculation (min) 52 min    Activity Tolerance Patient tolerated treatment well    Behavior During Therapy [facility] for tasks assessed/performed              Past Medical History:  Diagnosis Date   Asthma    Cough    Past Surgical History:  Procedure Laterality Date   CATARACT EXTRACTION W/PHACO Left 12/11/2022   Procedure: CATARACT EXTRACTION PHACO AND INTRAOCULAR LENS PLACEMENT (IOC);  Surgeon: Fabio Pierce, MD;  Location: [facility];  Service: Ophthalmology;  Laterality: Left;  CDE:9.97   CATARACT EXTRACTION W/PHACO Right 12/25/2022   Procedure: CATARACT EXTRACTION PHACO AND INTRAOCULAR LENS PLACEMENT (IOC);  Surgeon: Fabio Pierce, MD;  Location: [facility];  Service: Ophthalmology;  Laterality: Right;  CDE: 9.83   KNEE SURGERY     WISDOM TOOTH EXTRACTION     Patient Active Problem List   Diagnosis Date Noted   Gastroesophageal reflux disease 03/26/2023   Rash and other nonspecific skin eruption 08/13/2020   Left knee pain 12/05/2019   Allergic conjunctivitis 02/28/2019   Asthma with acute exacerbation 06/22/2016   Seasonal and perennial allergic rhinitis 02/17/2016   History of esophageal reflux 02/17/2016   Cough, persistent 02/17/2016   Not well controlled moderate persistent asthma 09/20/2013    REFERRING PROVIDER: Teryl Lucy MD  REFERRING DIAG: S/p left partial knee replacement.  THERAPY DIAG:  Chronic pain of left knee  Localized edema  Stiffness of left knee, not elsewhere classified  Rationale for Evaluation and Treatment: Rehabilitation  ONSET DATE: 09/30/23 (surgery date).  SUBJECTIVE:   SUBJECTIVE  STATEMENT: Patient reports that his knee is sore today. He didn't feel too bad until last night.   PERTINENT HISTORY: Unremarkable. PAIN:  Are you having pain? Yes: NPRS scale: 5/10 Pain location: Left knee. Pain description: Ache. Aggravating factors: Movement. Relieving factors: Rest.  PRECAUTIONS: Other: No ultrasound.   WEIGHT BEARING RESTRICTIONS: No  FALLS:  Has patient fallen in last 6 months? No  LIVING ENVIRONMENT: Lives with: lives with their spouse Lives in: House/apartment Has following equipment at home: None   PLOF: Independent   OBJECTIVE:  Note: Objective measures were completed at Evaluation unless otherwise noted.   EDEMA:  Circumferential: 4 cms greater on left than right.   PALPATION: No significant areas of tenderness today.  LOWER EXTREMITY ROM:  In supine, left knee extension is -5 degrees and seated flexion is 90 degrees.  LOWER EXTREMITY MMT:  Left hip flexion and and knee extension is a solid 4+/5.  GAIT: Mild stiff-legged gait pattern.  TREATMENT DATE:                                   10/07/23 EXERCISE LOG  Exercise Repetitions and Resistance Comments  Nustep  L3 x 16 minutes; seat 10-9   Rocker board  5 minutes   Lunges onto step  8" step x 3 minutes  LLE on step   LAQ  2 minutes  LLE only   Seated HS curl  Green t-band x 3 minutes    Blank cell = exercise not performed today  Modalities: no adverse reaction to today's modalities  Date:  Vaso: Knee, 34 degrees; low pressure, 15 mins, Pain and Edema  PATIENT EDUCATION:  Education details: healing and expectation for soreness  Person educated: patient  Education method: verbal Education comprehension: patient reported understanding  HOME EXERCISE PROGRAM:   ASSESSMENT:  CLINICAL IMPRESSION: Patient was introduced to multiple new  interventions for improved knee mobility and muscular engagement with moderate difficulty. He required minimal cueing with today's new interventions for proper exercise performance. He experienced no significant increase in pain or discomfort with any of today's interventions. He reported that his knee felt good, but he was hurting a little still upon the conclusion of treatment. He continues to require skilled physical therapy to address his remaining impairments to return to his prior level of function.     OBJECTIVE IMPAIRMENTS: decreased activity tolerance, decreased ROM, decreased strength, increased edema, and pain.   ACTIVITY LIMITATIONS: locomotion level  PARTICIPATION LIMITATIONS: cleaning and yard work  [facility] POTENTIAL: Excellent  CLINICAL DECISION MAKING: Stable/uncomplicated  EVALUATION COMPLEXITY: Low   GOALS:  LONG TERM GOALS: Target date: 11/15/23.  Ind with a HEP.  Goal status: INITIAL  2.  Full active left knee extension. Goal status: INITIAL  3.  Active knee flexion to 120 degrees+ so the patient can perform functional tasks and do so with pain not > 2-3/10.  Goal status: INITIAL  4.  Increase left hip and knee strength to a solid 5/5 to provide good stability for accomplishment of functional activities.  Goal status: INITIAL  5.  Decrease edema to within 2 cms of non-affected side to assist with pain reduction and range of motion gains.  Goal status: INITIAL  6.  Perform a reciprocating stair gait with one railing with pain not > 2-3/10.  Goal status: INITIAL   PLAN:  PT FREQUENCY: 2x/week  PT DURATION: 6 weeks  PLANNED INTERVENTIONS: 97110-Therapeutic exercises, 97530- Therapeutic activity, [id number]- Neuromuscular re-education, 97535- Self Care, 16109- Manual therapy, 97014- Electrical stimulation (unattended), 97016- Vasopneumatic device, Patient/Family education, Stair training, Cryotherapy, and Moist heat  PLAN FOR NEXT SESSION: Nustep, progress to  recumbent bike, left LE ROM.  LE elevation and vasopneumatic.     Granville Lewis, PT 10/07/2023, 10:53 AM

## 2023-10-12 ENCOUNTER — Ambulatory Visit: Payer: 59

## 2023-10-12 DIAGNOSIS — M25562 Pain in left knee: Secondary | ICD-10-CM | POA: Diagnosis not present

## 2023-10-12 DIAGNOSIS — M25662 Stiffness of left knee, not elsewhere classified: Secondary | ICD-10-CM

## 2023-10-12 DIAGNOSIS — G8929 Other chronic pain: Secondary | ICD-10-CM

## 2023-10-12 DIAGNOSIS — R600 Localized edema: Secondary | ICD-10-CM

## 2023-10-12 NOTE — Therapy (Signed)
OUTPATIENT PHYSICAL THERAPY LOWER EXTREMITY TREATMENT   Patient Name: Antonio Holmes MRN: [id number] DOB:[birth year], 63 y.o., male Today's Date: 10/12/2023  END OF SESSION:  PT End of Session - 10/12/23 0844     Visit Number 3    Number of Visits 12    Date for PT Re-Evaluation 11/15/23    PT Start Time 0845    PT Stop Time 0930    PT Time Calculation (min) 45 min    Activity Tolerance Patient tolerated treatment well    Behavior During Therapy [facility] for tasks assessed/performed               Past Medical History:  Diagnosis Date   Asthma    Cough    Past Surgical History:  Procedure Laterality Date   CATARACT EXTRACTION W/PHACO Left 12/11/2022   Procedure: CATARACT EXTRACTION PHACO AND INTRAOCULAR LENS PLACEMENT (IOC);  Surgeon: Fabio Pierce, MD;  Location: [facility];  Service: Ophthalmology;  Laterality: Left;  CDE:9.97   CATARACT EXTRACTION W/PHACO Right 12/25/2022   Procedure: CATARACT EXTRACTION PHACO AND INTRAOCULAR LENS PLACEMENT (IOC);  Surgeon: Fabio Pierce, MD;  Location: [facility];  Service: Ophthalmology;  Laterality: Right;  CDE: 9.83   KNEE SURGERY     WISDOM TOOTH EXTRACTION     Patient Active Problem List   Diagnosis Date Noted   Gastroesophageal reflux disease 03/26/2023   Rash and other nonspecific skin eruption 08/13/2020   Left knee pain 12/05/2019   Allergic conjunctivitis 02/28/2019   Asthma with acute exacerbation 06/22/2016   Seasonal and perennial allergic rhinitis 02/17/2016   History of esophageal reflux 02/17/2016   Cough, persistent 02/17/2016   Not well controlled moderate persistent asthma 09/20/2013    REFERRING PROVIDER: Teryl Lucy MD  REFERRING DIAG: S/p left partial knee replacement.  THERAPY DIAG:  Chronic pain of left knee  Localized edema  Stiffness of left knee, not elsewhere classified  Rationale for Evaluation and Treatment: Rehabilitation  ONSET DATE: 09/30/23 (surgery date).  SUBJECTIVE:   SUBJECTIVE  STATEMENT: Patient reports that he is not sore or hurting any right now. However, he was sore after his last appointment. He was also stiff after getting up wood yesterday.   PERTINENT HISTORY: Unremarkable. PAIN:  Are you having pain? Yes: NPRS scale: 0/10 Pain location: Left knee. Pain description: Ache. Aggravating factors: Movement. Relieving factors: Rest.  PRECAUTIONS: Other: No ultrasound.   WEIGHT BEARING RESTRICTIONS: No  FALLS:  Has patient fallen in last 6 months? No  LIVING ENVIRONMENT: Lives with: lives with their spouse Lives in: House/apartment Has following equipment at home: None   PLOF: Independent   OBJECTIVE:  Note: Objective measures were completed at Evaluation unless otherwise noted.   EDEMA:  Circumferential: 4 cms greater on left than right.   PALPATION: No significant areas of tenderness today.  LOWER EXTREMITY ROM:  In supine, left knee extension is -5 degrees and seated flexion is 90 degrees.  LOWER EXTREMITY MMT:  Left hip flexion and and knee extension is a solid 4+/5.  GAIT: Mild stiff-legged gait pattern.  TREATMENT DATE:                                   10/12/23 EXERCISE LOG  Exercise Repetitions and Resistance Comments  Nustep  L4 x 15 minutes; seat 8-7   Rocker board  5 minutes   Standing HS stretch  4 x 30 seconds  LLE only  Marching on foam 3.5 minutes    Lunges onto step  14" step x 3 minutes    Step up  6" step x 25 reps  Leading with LLE    Blank cell = exercise not performed today                                    10/07/23 EXERCISE LOG  Exercise Repetitions and Resistance Comments  Nustep  L3 x 16 minutes; seat 10-9   Rocker board  5 minutes   Lunges onto step  8" step x 3 minutes  LLE on step   LAQ  2 minutes  LLE only   Seated HS curl  Green t-band x 3 minutes    Blank cell =  exercise not performed today  Modalities: no adverse reaction to today's modalities  Date:  Vaso: Knee, 34 degrees; low pressure, 15 mins, Pain and Edema  PATIENT EDUCATION:  Education details: healing and expectation for soreness  Person educated: patient  Education method: verbal Education comprehension: patient reported understanding  HOME EXERCISE PROGRAM:   ASSESSMENT:  CLINICAL IMPRESSION: Patient was progressed with step ups and a standing hamstring stretch for improved household mobility. He required minimal cueing with a standing hamstring stretch for proper positioning to facilitate soft tissue extensibility. He experienced no increase in pain or discomfort with any of today's interventions. He reported that his knee felt good upon the conclusion of treatment. He continues to require skilled physical therapy to address his remaining impairments to return to his prior level of function.    OBJECTIVE IMPAIRMENTS: decreased activity tolerance, decreased ROM, decreased strength, increased edema, and pain.   ACTIVITY LIMITATIONS: locomotion level  PARTICIPATION LIMITATIONS: cleaning and yard work  [facility] POTENTIAL: Excellent  CLINICAL DECISION MAKING: Stable/uncomplicated  EVALUATION COMPLEXITY: Low   GOALS:  LONG TERM GOALS: Target date: 11/15/23.  Ind with a HEP.  Goal status: INITIAL  2.  Full active left knee extension. Goal status: INITIAL  3.  Active knee flexion to 120 degrees+ so the patient can perform functional tasks and do so with pain not > 2-3/10.  Goal status: INITIAL  4.  Increase left hip and knee strength to a solid 5/5 to provide good stability for accomplishment of functional activities.  Goal status: INITIAL  5.  Decrease edema to within 2 cms of non-affected side to assist with pain reduction and range of motion gains.  Goal status: INITIAL  6.  Perform a reciprocating stair gait with one railing with pain not > 2-3/10.  Goal status:  INITIAL   PLAN:  PT FREQUENCY: 2x/week  PT DURATION: 6 weeks  PLANNED INTERVENTIONS: 97110-Therapeutic exercises, 97530- Therapeutic activity, [id number]- Neuromuscular re-education, 97535- Self Care, 16109- Manual therapy, 97014- Electrical stimulation (unattended), 97016- Vasopneumatic device, Patient/Family education, Stair training, Cryotherapy, and Moist heat  PLAN FOR NEXT SESSION: Nustep, progress to recumbent bike, left LE ROM.  LE elevation and vasopneumatic.     Granville Lewis, PT 10/12/2023,  10:58 AM

## 2023-10-13 ENCOUNTER — Ambulatory Visit (INDEPENDENT_AMBULATORY_CARE_PROVIDER_SITE_OTHER): Payer: 59 | Admitting: Allergy & Immunology

## 2023-10-13 ENCOUNTER — Other Ambulatory Visit: Payer: Self-pay

## 2023-10-13 VITALS — HR 99 | Temp 98.0°F | Ht 64.0 in | Wt 196.6 lb

## 2023-10-13 DIAGNOSIS — K219 Gastro-esophageal reflux disease without esophagitis: Secondary | ICD-10-CM | POA: Diagnosis not present

## 2023-10-13 DIAGNOSIS — J4540 Moderate persistent asthma, uncomplicated: Secondary | ICD-10-CM

## 2023-10-13 DIAGNOSIS — J302 Other seasonal allergic rhinitis: Secondary | ICD-10-CM

## 2023-10-13 DIAGNOSIS — J3089 Other allergic rhinitis: Secondary | ICD-10-CM

## 2023-10-13 DIAGNOSIS — Z91038 Other insect allergy status: Secondary | ICD-10-CM

## 2023-10-13 MED ORDER — PULMICORT FLEXHALER 90 MCG/ACT IN AEPB: 2.0000 | INHALATION_SPRAY | Freq: Two times a day (BID) | RESPIRATORY_TRACT | 5 refills | Status: DC

## 2023-10-13 MED ORDER — MONTELUKAST SODIUM 10 MG PO TABS: ORAL_TABLET | ORAL | 3 refills | Status: DC

## 2023-10-13 MED ORDER — ALBUTEROL SULFATE HFA 108 (90 BASE) MCG/ACT IN AERS: INHALATION_SPRAY | RESPIRATORY_TRACT | 1 refills | Status: DC

## 2023-10-13 NOTE — Patient Instructions (Addendum)
Allergic rhinitis (grasses, weeds, ragweed, trees, molds, dust mites, cat, dog, cockroach) - You seem to have an excellent handle on your symptoms.  - Continue montelukast 10 mg once a day to control allergy symptoms. - Continue with the Flonase 2 sprays in each nostril once a day as needed for runny nose during the worst seasons.  - Consider saline nasal rinses as needed for nasal symptoms.   Asthma - Lung testing looks stellar today. - Daily controller medication(s): Pulmicort Flexhaler 1 puff twice daily - Prior to physical activity: albuterol 2 puffs 10-15 minutes before physical activity. - Rescue medications: albuterol 4 puffs every 4-6 hours as needed - Changes during respiratory infections or worsening symptoms: Increase Pulmicort to 2 puffs twice daily for ONE TO TWO WEEKS. - Asthma control goals:  * Full participation in all desired activities (may need albuterol before activity) * Albuterol use two time or less a week on average (not counting use with activity) * Cough interfering with sleep two time or less a month * Oral steroids no more than once a year * No hospitalizations  Reflux - Continue dietary and lifestyle modifications. - Continue famotidine 20 mg twice a day to prevent reflux.  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  - Continue to avoid stinging insects.   Return in about 6 months (around 04/11/2024). You can have the follow up appointment with Dr. Dellis Anes or a Nurse Practicioner (our Nurse Practitioners are excellent and always have Physician oversight!).    Please inform us of any Emergency Department visits, hospitalizations, or changes in symptoms. Call us before going to the ED for breathing or allergy symptoms since we might be able to fit you in for a sick visit. Feel free to contact us anytime with any questions, problems, or concerns.  It was a pleasure to see you again today!  Websites that have reliable patient information: 1.  American Academy of Asthma, Allergy, and Immunology: www.aaaai.org 2. Food Allergy Research and Education (FARE): foodallergy.org 3. Mothers of Asthmatics: http://www.asthmacommunitynetwork.org 4. American College of Allergy, Asthma, and Immunology: www.acaai.org      "Like" [place] on Facebook and Instagram for our latest updates!      A healthy democracy works best when [employer] participate! Make sure you are registered to vote! If you have moved or changed any of your contact information, you will need to get this updated before voting! Scan the QR codes below to learn more!

## 2023-10-13 NOTE — Progress Notes (Unsigned)
FOLLOW UP  Date of Service/Encounter:  10/13/23   Assessment:   Moderate persistent asthma without complication - now asymptomatic without medications likely secondary to allergic rhinitis control and GERD control   Seasonal and perennial allergic rhinitis - on allergen immunotherapy (maintenance reached April 2018) - now s/p 5 years of allergen immunotherapy    GERD - on PPI + H2 blocker with good control    Plan/Recommendations:   Assessment and Plan              Patient Instructions  Allergic rhinitis (grasses, weeds, ragweed, trees, molds, dust mites, cat, dog, cockroach) - You seem to have an excellent handle on your symptoms.  - Continue montelukast 10 mg once a day to control allergy symptoms. - Continue with the Flonase 2 sprays in each nostril once a day as needed for runny nose during the worst seasons.  - Consider saline nasal rinses as needed for nasal symptoms.   Asthma - Lung testing looks stellar today. - Daily controller medication(s): Pulmicort Flexhaler 1 puff twice daily - Prior to physical activity: albuterol 2 puffs 10-15 minutes before physical activity. - Rescue medications: albuterol 4 puffs every 4-6 hours as needed - Changes during respiratory infections or worsening symptoms: Increase Pulmicort to 2 puffs twice daily for ONE TO TWO WEEKS. - Asthma control goals:  * Full participation in all desired activities (may need albuterol before activity) * Albuterol use two time or less a week on average (not counting use with activity) * Cough interfering with sleep two time or less a month * Oral steroids no more than once a year * No hospitalizations  Reflux - Continue dietary and lifestyle modifications. - Continue famotidine 20 mg twice a day to prevent reflux.  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  - Continue to avoid stinging insects.   Return in about 6 months (around 04/11/2024). You can have the follow up appointment  with Dr. Dellis Anes or a Nurse Practicioner (our Nurse Practitioners are excellent and always have Physician oversight!).    Please inform us of any Emergency Department visits, hospitalizations, or changes in symptoms. Call us before going to the ED for breathing or allergy symptoms since we might be able to fit you in for a sick visit. Feel free to contact us anytime with any questions, problems, or concerns.  It was a pleasure to see you again today!  Websites that have reliable patient information: 1. American Academy of Asthma, Allergy, and Immunology: www.aaaai.org 2. Food Allergy Research and Education (FARE): foodallergy.org 3. Mothers of Asthmatics: http://www.asthmacommunitynetwork.org 4. American College of Allergy, Asthma, and Immunology: www.acaai.org      "Like" [place] on Facebook and Instagram for our latest updates!      A healthy democracy works best when [employer] participate! Make sure you are registered to vote! If you have moved or changed any of your contact information, you will need to get this updated before voting! Scan the QR codes below to learn more!              Subjective:   Antonio Holmes is a 63 y.o. male presenting today for follow up of  Chief Complaint  Patient presents with   Asthma    No concerns    Antonio Holmes has a history of the following: Patient Active Problem List   Diagnosis Date Noted   Gastroesophageal reflux disease 03/26/2023   Rash and other nonspecific skin eruption 08/13/2020   Left  knee pain 12/05/2019   Allergic conjunctivitis 02/28/2019   Asthma with acute exacerbation 06/22/2016   Seasonal and perennial allergic rhinitis 02/17/2016   History of esophageal reflux 02/17/2016   Cough, persistent 02/17/2016   Not well controlled moderate persistent asthma 09/20/2013    History obtained from: chart review and {Persons; PED relatives w/patient:19415::"patient"}.  Discussed the use of AI scribe software for  clinical note transcription with the patient and/or guardian, who gave verbal consent to proceed.  Antonio Holmes is a 63 y.o. male presenting for {Blank single:19197::"a food challenge","a drug challenge","skin testing","a sick visit","an evaluation of ***","a follow up visit"}. He was last seen in November 2024 by Thermon Leyland, one of our Nurse Practitioners.  At that visit, we continue with Flonase for his allergic rhinitis.  For his asthma, we increased his Pulmicort to 90 mcg 2 puffs twice a day.  For his reflux, he continued on famotidine 20 mg twice a day.  He did get blood work to look at a stinging insect panel.  This was never collected.  Discussed the use of AI scribe software for clinical note transcription with the patient, who gave verbal consent to proceed.  History of Present Illness            Asthma/Respiratory Symptom History: ***  Allergic Rhinitis Symptom History: ***  Food Allergy Symptom History: ***  Skin Symptom History: ***  GERD Symptom History: ***  Infection Symptom History: ***  Otherwise, there have been no changes to his past medical history, surgical history, family history, or social history.    Review of systems otherwise negative other than that mentioned in the HPI.    Objective:   Pulse 99, temperature 98 F (36.7 C), height 5\' 4"  (1.626 m), weight 196 lb 9.6 oz (89.2 kg), SpO2 97%. Body mass index is 33.75 kg/m.    Physical Exam   Diagnostic studies:    Spirometry: results normal (FEV1: 2.37/84%, FVC: 3.14/87%, FEV1/FVC: 75%).    Spirometry consistent with normal pattern.   Allergy Studies: {Blank single:19197::"none","deferred due to recent antihistamine use","deferred due to insurance stipulations that require a separate visit for testing","labs sent instead"," "}    {Blank single:19197::"Allergy testing results were read and interpreted by myself, documented by clinical staff."," "}      Antonio Bonds, MD  Allergy and Asthma  [facility]

## 2023-10-14 ENCOUNTER — Encounter: Payer: Self-pay | Admitting: Allergy & Immunology

## 2023-10-19 ENCOUNTER — Ambulatory Visit: Payer: 59

## 2023-10-19 DIAGNOSIS — G8929 Other chronic pain: Secondary | ICD-10-CM

## 2023-10-19 DIAGNOSIS — M25662 Stiffness of left knee, not elsewhere classified: Secondary | ICD-10-CM

## 2023-10-19 DIAGNOSIS — R600 Localized edema: Secondary | ICD-10-CM

## 2023-10-19 DIAGNOSIS — M25562 Pain in left knee: Secondary | ICD-10-CM | POA: Diagnosis not present

## 2023-10-19 NOTE — Therapy (Signed)
 OUTPATIENT PHYSICAL THERAPY LOWER EXTREMITY TREATMENT   Patient Name: Antonio Holmes MRN: [id number] DOB:1961/06/23, 63 y.o., male Today's Date: 10/19/2023  END OF SESSION:  PT End of Session - 10/19/23 1008     Visit Number 4    Number of Visits 12    Date for PT Re-Evaluation 11/15/23    PT Start Time 0957    PT Stop Time 1057    PT Time Calculation (min) 60 min    Activity Tolerance Patient tolerated treatment well    Behavior During Therapy [facility] for tasks assessed/performed                Past Medical History:  Diagnosis Date   Asthma    Cough    Past Surgical History:  Procedure Laterality Date   CATARACT EXTRACTION W/PHACO Left 12/11/2022   Procedure: CATARACT EXTRACTION PHACO AND INTRAOCULAR LENS PLACEMENT (IOC);  Surgeon: Fabio Pierce, MD;  Location: [facility];  Service: Ophthalmology;  Laterality: Left;  CDE:9.97   CATARACT EXTRACTION W/PHACO Right 12/25/2022   Procedure: CATARACT EXTRACTION PHACO AND INTRAOCULAR LENS PLACEMENT (IOC);  Surgeon: Fabio Pierce, MD;  Location: [facility];  Service: Ophthalmology;  Laterality: Right;  CDE: 9.83   KNEE SURGERY     WISDOM TOOTH EXTRACTION     Patient Active Problem List   Diagnosis Date Noted   Gastroesophageal reflux disease 03/26/2023   Rash and other nonspecific skin eruption 08/13/2020   Left knee pain 12/05/2019   Allergic conjunctivitis 02/28/2019   Asthma with acute exacerbation 06/22/2016   Seasonal and perennial allergic rhinitis 02/17/2016   History of esophageal reflux 02/17/2016   Cough, persistent 02/17/2016   Not well controlled moderate persistent asthma 09/20/2013    REFERRING PROVIDER: Teryl Lucy MD  REFERRING DIAG: S/p left partial knee replacement.  THERAPY DIAG:  Chronic pain of left knee  Localized edema  Stiffness of left knee, not elsewhere classified  Rationale for Evaluation and Treatment: Rehabilitation  ONSET DATE: 09/30/23 (surgery date).  SUBJECTIVE:   SUBJECTIVE  STATEMENT: Patient reports that his knee scared him last night when he had a sharp pain behind his knee. However, he is not having any pain this morning and he is able to bend and move his knee like normal.  PERTINENT HISTORY: Unremarkable. PAIN:  Are you having pain? Yes: NPRS scale: 0/10 Pain location: Left knee. Pain description: Ache. Aggravating factors: Movement. Relieving factors: Rest.  PRECAUTIONS: Other: No ultrasound.   WEIGHT BEARING RESTRICTIONS: No  FALLS:  Has patient fallen in last 6 months? No  LIVING ENVIRONMENT: Lives with: lives with their spouse Lives in: House/apartment Has following equipment at home: None   PLOF: Independent   OBJECTIVE:  Note: Objective measures were completed at Evaluation unless otherwise noted.   EDEMA:  Circumferential: 4 cms greater on left than right.   PALPATION: No significant areas of tenderness today.  LOWER EXTREMITY ROM:  In supine, left knee extension is -5 degrees and seated flexion is 90 degrees.  LOWER EXTREMITY MMT:  Left hip flexion and and knee extension is a solid 4+/5.  GAIT: Mild stiff-legged gait pattern.  TREATMENT DATE:                                   10/19/23 EXERCISE LOG  Exercise Repetitions and Resistance Comments  Nustep  L4 x 10 minutes; seat 5   Recumbent bike  L3 x 8 minutes; seat 3   Rocker board  6 minutes    Work simulation Blue t-band x 3 minutes  Simulating pressing his clutch   Seated hip ADD isometric  4 minutes w/ 5 second hold For returning to riding his horses  Sit to stand  15 reps  Without UE support    Blank cell = exercise not performed today  Modalities: no adverse reaction to today's modalities  Date:  Vaso: Knee, 34 degrees; low pressure, 15 mins, soreness                                   10/12/23 EXERCISE LOG  Exercise Repetitions  and Resistance Comments  Nustep  L4 x 15 minutes; seat 8-7   Rocker board  5 minutes   Standing HS stretch  4 x 30 seconds  LLE only  Marching on foam 3.5 minutes    Lunges onto step  14" step x 3 minutes    Step up  6" step x 25 reps  Leading with LLE    Blank cell = exercise not performed today                                    10/07/23 EXERCISE LOG  Exercise Repetitions and Resistance Comments  Nustep  L3 x 16 minutes; seat 10-9   Rocker board  5 minutes   Lunges onto step  8" step x 3 minutes  LLE on step   LAQ  2 minutes  LLE only   Seated HS curl  Green t-band x 3 minutes    Blank cell = exercise not performed today  Modalities: no adverse reaction to today's modalities  Date:  Vaso: Knee, 34 degrees; low pressure, 15 mins, Pain and Edema  PATIENT EDUCATION:  Education details: healing and expectation for soreness  Person educated: patient  Education method: verbal Education comprehension: patient reported understanding  HOME EXERCISE PROGRAM:   ASSESSMENT:  CLINICAL IMPRESSION: Patient was progressed with multiple new interventions for improved function with his daily activities such as pressing the clutch on his tractor. He required minimal cueing with work simulation for proper exercise performance to avoid compensatory movement patterns. Muscular fatigue and soreness were his primary limitation with today's interventions with sit to stands being the most fatiguing. However, this did not limit his ability to complete any of today's interventions. He reported that his knee felt good, but tired upon the conclusion of treatment. He continues to require skilled physical therapy to address his remaining impairments to return to his prior level of function.   OBJECTIVE IMPAIRMENTS: decreased activity tolerance, decreased ROM, decreased strength, increased edema, and pain.   ACTIVITY LIMITATIONS: locomotion level  PARTICIPATION LIMITATIONS: cleaning and yard work  [facility]  POTENTIAL: Excellent  CLINICAL DECISION MAKING: Stable/uncomplicated  EVALUATION COMPLEXITY: Low   GOALS:  LONG TERM GOALS: Target date: 11/15/23.  Ind with a HEP.  Goal status: INITIAL  2.  Full active left knee extension. Goal status: INITIAL  3.  Active knee flexion to 120 degrees+ so the patient can perform functional tasks and do so with pain not > 2-3/10.  Goal status: INITIAL  4.  Increase left hip and knee strength to a solid 5/5 to provide good stability for accomplishment of functional activities.  Goal status: INITIAL  5.  Decrease edema to within 2 cms of non-affected side to assist with pain reduction and range of motion gains.  Goal status: INITIAL  6.  Perform a reciprocating stair gait with one railing with pain not > 2-3/10.  Goal status: INITIAL   PLAN:  PT FREQUENCY: 2x/week  PT DURATION: 6 weeks  PLANNED INTERVENTIONS: 97110-Therapeutic exercises, 97530- Therapeutic activity, [id number]- Neuromuscular re-education, 97535- Self Care, 32440- Manual therapy, 97014- Electrical stimulation (unattended), 97016- Vasopneumatic device, Patient/Family education, Stair training, Cryotherapy, and Moist heat  PLAN FOR NEXT SESSION: Nustep, progress to recumbent bike, left LE ROM.  LE elevation and vasopneumatic.     Granville Lewis, PT 10/19/2023, 11:00 AM

## 2023-10-21 ENCOUNTER — Ambulatory Visit: Payer: 59

## 2023-10-21 DIAGNOSIS — G8929 Other chronic pain: Secondary | ICD-10-CM

## 2023-10-21 DIAGNOSIS — M25562 Pain in left knee: Secondary | ICD-10-CM | POA: Diagnosis not present

## 2023-10-21 DIAGNOSIS — R600 Localized edema: Secondary | ICD-10-CM

## 2023-10-21 DIAGNOSIS — M25662 Stiffness of left knee, not elsewhere classified: Secondary | ICD-10-CM

## 2023-10-21 NOTE — Therapy (Signed)
 OUTPATIENT PHYSICAL THERAPY LOWER EXTREMITY TREATMENT   Patient Name: Antonio Holmes MRN: [id number] DOB:[birth year], 63 y.o., male Today's Date: 10/21/2023  END OF SESSION:  PT End of Session - 10/21/23 1559     Visit Number 5    Number of Visits 12    Date for PT Re-Evaluation 11/15/23    PT Start Time 1557    PT Stop Time 1643    PT Time Calculation (min) 46 min    Activity Tolerance Patient tolerated treatment well    Behavior During Therapy [facility] for tasks assessed/performed                Past Medical History:  Diagnosis Date   Asthma    Cough    Past Surgical History:  Procedure Laterality Date   CATARACT EXTRACTION W/PHACO Left 12/11/2022   Procedure: CATARACT EXTRACTION PHACO AND INTRAOCULAR LENS PLACEMENT (IOC);  Surgeon: Fabio Pierce, MD;  Location: [facility];  Service: Ophthalmology;  Laterality: Left;  CDE:9.97   CATARACT EXTRACTION W/PHACO Right 12/25/2022   Procedure: CATARACT EXTRACTION PHACO AND INTRAOCULAR LENS PLACEMENT (IOC);  Surgeon: Fabio Pierce, MD;  Location: [facility];  Service: Ophthalmology;  Laterality: Right;  CDE: 9.83   KNEE SURGERY     WISDOM TOOTH EXTRACTION     Patient Active Problem List   Diagnosis Date Noted   Gastroesophageal reflux disease 03/26/2023   Rash and other nonspecific skin eruption 08/13/2020   Left knee pain 12/05/2019   Allergic conjunctivitis 02/28/2019   Asthma with acute exacerbation 06/22/2016   Seasonal and perennial allergic rhinitis 02/17/2016   History of esophageal reflux 02/17/2016   Cough, persistent 02/17/2016   Not well controlled moderate persistent asthma 09/20/2013    REFERRING PROVIDER: Teryl Lucy MD  REFERRING DIAG: S/p left partial knee replacement.  THERAPY DIAG:  Chronic pain of left knee  Localized edema  Stiffness of left knee, not elsewhere classified  Rationale for Evaluation and Treatment: Rehabilitation  ONSET DATE: 09/30/23 (surgery date).  SUBJECTIVE:   SUBJECTIVE  STATEMENT: Pt reports a pull in left knee after "moving too quickly," but denies any pain at this time.   PERTINENT HISTORY: Unremarkable. PAIN:  Are you having pain? No  PRECAUTIONS: Other: No ultrasound.   WEIGHT BEARING RESTRICTIONS: No  FALLS:  Has patient fallen in last 6 months? No  LIVING ENVIRONMENT: Lives with: lives with their spouse Lives in: House/apartment Has following equipment at home: None   PLOF: Independent   OBJECTIVE:  Note: Objective measures were completed at Evaluation unless otherwise noted.   EDEMA:  Circumferential: 4 cms greater on left than right.   PALPATION: No significant areas of tenderness today.  LOWER EXTREMITY ROM:  In supine, left knee extension is -5 degrees and seated flexion is 90 degrees.  LOWER EXTREMITY MMT:  Left hip flexion and and knee extension is a solid 4+/5.  GAIT: Mild stiff-legged gait pattern.  TREATMENT DATE:                                   10/19/23 EXERCISE LOG  Exercise Repetitions and Resistance Comments  Nustep  L4 x 5 minutes; seat 5   Recumbent bike  L3 x 15 minutes; seat 3   Rocker board  6 minutes    Forward Step Ups 6" box x 25 reps   Work simulation    [employer] 3# x 25 reps bil   Seated Marches 3# x 25 reps bil   Seated Ham Curls Green x 25 reps bil   Seated hip ADD isometric     Sit to stand  15 reps  Without UE support    Blank cell = exercise not performed today                                     10/12/23 EXERCISE LOG  Exercise Repetitions and Resistance Comments  Nustep  L4 x 15 minutes; seat 8-7   Rocker board  5 minutes   Standing HS stretch  4 x 30 seconds  LLE only  Marching on foam 3.5 minutes    Lunges onto step  14" step x 3 minutes    Step up  6" step x 25 reps  Leading with LLE    Blank cell = exercise not performed today                                     10/07/23 EXERCISE LOG  Exercise Repetitions and Resistance Comments  Nustep  L3 x 16 minutes; seat 10-9   Rocker board  5 minutes   Lunges onto step  8" step x 3 minutes  LLE on step   LAQ  2 minutes  LLE only   Seated HS curl  Green t-band x 3 minutes    Blank cell = exercise not performed today  Modalities: no adverse reaction to today's modalities  Date:  Vaso: Knee, 34 degrees; low pressure, 15 mins, Pain and Edema  PATIENT EDUCATION:  Education details: healing and expectation for soreness  Person educated: patient  Education method: verbal Education comprehension: patient reported understanding  HOME EXERCISE PROGRAM:   ASSESSMENT:  CLINICAL IMPRESSION: Pt arrives for today's treatment session denying any pain.  Pt able to tolerate increased time and progress to seat 3 on the recumbent bike today.  Pt introduced to forward step ups with min cues required for sequencing and to avoid pulling with BUEs.  Pt able to tolerate increased reps or time with all seated exercises today.  Pt denied any pain at completion of today's treatment session.   OBJECTIVE IMPAIRMENTS: decreased activity tolerance, decreased ROM, decreased strength, increased edema, and pain.   ACTIVITY LIMITATIONS: locomotion level  PARTICIPATION LIMITATIONS: cleaning and yard work  [facility] POTENTIAL: Excellent  CLINICAL DECISION MAKING: Stable/uncomplicated  EVALUATION COMPLEXITY: Low   GOALS:  LONG TERM GOALS: Target date: 11/15/23.  Ind with a HEP.  Goal status: INITIAL  2.  Full active left knee extension. Goal status: INITIAL  3.  Active knee flexion to 120 degrees+ so the patient can perform functional tasks and do so with pain not > 2-3/10.  Goal status: INITIAL  4.  Increase left hip and  knee strength to a solid 5/5 to provide good stability for accomplishment of functional activities.  Goal status: INITIAL  5.  Decrease edema to within 2 cms of non-affected side to assist  with pain reduction and range of motion gains.  Goal status: INITIAL  6.  Perform a reciprocating stair gait with one railing with pain not > 2-3/10.  Goal status: INITIAL   PLAN:  PT FREQUENCY: 2x/week  PT DURATION: 6 weeks  PLANNED INTERVENTIONS: 97110-Therapeutic exercises, 97530- Therapeutic activity, [id number]- Neuromuscular re-education, 97535- Self Care, 16109- Manual therapy, 97014- Electrical stimulation (unattended), 97016- Vasopneumatic device, Patient/Family education, Stair training, Cryotherapy, and Moist heat  PLAN FOR NEXT SESSION: Nustep, progress to recumbent bike, left LE ROM.  LE elevation and vasopneumatic.     Newman Pies, PTA 10/21/2023, 4:50 PM

## 2023-10-26 ENCOUNTER — Ambulatory Visit: Payer: 59 | Attending: Orthopedic Surgery

## 2023-10-26 DIAGNOSIS — M25662 Stiffness of left knee, not elsewhere classified: Secondary | ICD-10-CM | POA: Diagnosis present

## 2023-10-26 DIAGNOSIS — R600 Localized edema: Secondary | ICD-10-CM | POA: Diagnosis present

## 2023-10-26 DIAGNOSIS — G8929 Other chronic pain: Secondary | ICD-10-CM | POA: Insufficient documentation

## 2023-10-26 DIAGNOSIS — M25562 Pain in left knee: Secondary | ICD-10-CM | POA: Diagnosis present

## 2023-10-26 NOTE — Therapy (Signed)
 OUTPATIENT PHYSICAL THERAPY LOWER EXTREMITY TREATMENT   Patient Name: Antonio Holmes MRN: [id number] DOB:1961/04/18, 63 y.o., male Today's Date: 10/26/2023  END OF SESSION:  PT End of Session - 10/26/23 1520     Visit Number 6    Number of Visits 12    Date for PT Re-Evaluation 11/15/23    PT Start Time 1515    PT Stop Time 1600    PT Time Calculation (min) 45 min    Activity Tolerance Patient tolerated treatment well    Behavior During Therapy [facility] for tasks assessed/performed                Past Medical History:  Diagnosis Date   Asthma    Cough    Past Surgical History:  Procedure Laterality Date   CATARACT EXTRACTION W/PHACO Left 12/11/2022   Procedure: CATARACT EXTRACTION PHACO AND INTRAOCULAR LENS PLACEMENT (IOC);  Surgeon: Fabio Pierce, MD;  Location: [facility];  Service: Ophthalmology;  Laterality: Left;  CDE:9.97   CATARACT EXTRACTION W/PHACO Right 12/25/2022   Procedure: CATARACT EXTRACTION PHACO AND INTRAOCULAR LENS PLACEMENT (IOC);  Surgeon: Fabio Pierce, MD;  Location: [facility];  Service: Ophthalmology;  Laterality: Right;  CDE: 9.83   KNEE SURGERY     WISDOM TOOTH EXTRACTION     Patient Active Problem List   Diagnosis Date Noted   Gastroesophageal reflux disease 03/26/2023   Rash and other nonspecific skin eruption 08/13/2020   Left knee pain 12/05/2019   Allergic conjunctivitis 02/28/2019   Asthma with acute exacerbation 06/22/2016   Seasonal and perennial allergic rhinitis 02/17/2016   History of esophageal reflux 02/17/2016   Cough, persistent 02/17/2016   Not well controlled moderate persistent asthma 09/20/2013    REFERRING PROVIDER: Teryl Lucy MD  REFERRING DIAG: S/p left partial knee replacement.  THERAPY DIAG:  Chronic pain of left knee  Localized edema  Stiffness of left knee, not elsewhere classified  Rationale for Evaluation and Treatment: Rehabilitation  ONSET DATE: 09/30/23 (surgery date).  SUBJECTIVE:   SUBJECTIVE  STATEMENT: Pt denies any pain today.  Pt reports increased quad soreness after last treatment session.    PERTINENT HISTORY: Unremarkable. PAIN:  Are you having pain? No  PRECAUTIONS: Other: No ultrasound.   WEIGHT BEARING RESTRICTIONS: No  FALLS:  Has patient fallen in last 6 months? No  LIVING ENVIRONMENT: Lives with: lives with their spouse Lives in: House/apartment Has following equipment at home: None   PLOF: Independent   OBJECTIVE:  Note: Objective measures were completed at Evaluation unless otherwise noted.   EDEMA:  Circumferential: 4 cms greater on left than right.   PALPATION: No significant areas of tenderness today.  LOWER EXTREMITY ROM:  In supine, left knee extension is -5 degrees and seated flexion is 90 degrees.  LOWER EXTREMITY MMT:  Left hip flexion and and knee extension is a solid 4+/5.  GAIT: Mild stiff-legged gait pattern.  TREATMENT DATE:                                   10/26/23 EXERCISE LOG  Exercise Repetitions and Resistance Comments  Nustep     Recumbent bike  L3 x 15 minutes; seat 3   Rocker board  6 minutes    Forward Lunges 14" box x 3 mins   Work simulation    [employer] 3# x 30 reps bil   Seated Marches 3# x 30 reps bil   Seated Ham Curls Green x 25 reps bil   Seated hip ADD isometric     Sit to stand   Without UE support    Blank cell = exercise not performed today                                     10/12/23 EXERCISE LOG  Exercise Repetitions and Resistance Comments  Nustep  L4 x 15 minutes; seat 8-7   Rocker board  5 minutes   Standing HS stretch  4 x 30 seconds  LLE only  Marching on foam 3.5 minutes    Lunges onto step  14" step x 3 minutes    Step up  6" step x 25 reps  Leading with LLE    Blank cell = exercise not performed today                                    10/07/23 EXERCISE  LOG  Exercise Repetitions and Resistance Comments  Nustep  L3 x 16 minutes; seat 10-9   Rocker board  5 minutes   Lunges onto step  8" step x 3 minutes  LLE on step   LAQ  2 minutes  LLE only   Seated HS curl  Green t-band x 3 minutes    Blank cell = exercise not performed today  Modalities: no adverse reaction to today's modalities  Date:  Vaso: Knee, 34 degrees; low pressure, 15 mins, Pain and Edema  PATIENT EDUCATION:  Education details: healing and expectation for soreness  Person educated: patient  Education method: verbal Education comprehension: patient reported understanding  HOME EXERCISE PROGRAM:   ASSESSMENT:  CLINICAL IMPRESSION: Pt arrives for today's treatment session denying any pain. Pt able to navigate four steps with reciprocal pattern safely without use of hand rail today, meeting his LTG.  Pt also able to demonstrate 127 degrees of left knee flexion and full extension, meeting both of his ROM goals.  Pt able to tolerate increased reps with all previously performed exercises.  Pt denied any pain at completion of today's treatment session.  OBJECTIVE IMPAIRMENTS: decreased activity tolerance, decreased ROM, decreased strength, increased edema, and pain.   ACTIVITY LIMITATIONS: locomotion level  PARTICIPATION LIMITATIONS: cleaning and yard work  [facility] POTENTIAL: Excellent  CLINICAL DECISION MAKING: Stable/uncomplicated  EVALUATION COMPLEXITY: Low   GOALS:  LONG TERM GOALS: Target date: 11/15/23.  Ind with a HEP.  Goal status: IN PROGRESS  2.  Full active left knee extension. Goal status: MET  3.  Active knee flexion to 120 degrees+ so the patient can perform functional tasks and do so with pain not > 2-3/10.   3/4: 127 degrees Goal status: MET  4.  Increase left hip and knee strength  to a solid 5/5 to provide good stability for accomplishment of functional activities.  Goal status: IN PROGRESS  5.  Decrease edema to within 2 cms of  non-affected side to assist with pain reduction and range of motion gains.   3/4: 3.5 cms difference Goal status: IN PROGRESS  6.  Perform a reciprocating stair gait with one railing with pain not > 2-3/10.  Goal status: MET   PLAN:  PT FREQUENCY: 2x/week  PT DURATION: 6 weeks  PLANNED INTERVENTIONS: 97110-Therapeutic exercises, 97530- Therapeutic activity, [id number]- Neuromuscular re-education, 97535- Self Care, [id number]- Manual therapy, 97014- Electrical stimulation (unattended), 97016- Vasopneumatic device, Patient/Family education, Stair training, Cryotherapy, and Moist heat  PLAN FOR NEXT SESSION: Nustep, progress to recumbent bike, left LE ROM.  LE elevation and vasopneumatic.     Newman Pies, PTA 10/26/2023, 4:10 PM

## 2023-10-28 ENCOUNTER — Ambulatory Visit: Payer: 59 | Admitting: Physical Therapy

## 2023-10-28 DIAGNOSIS — G8929 Other chronic pain: Secondary | ICD-10-CM

## 2023-10-28 DIAGNOSIS — M25662 Stiffness of left knee, not elsewhere classified: Secondary | ICD-10-CM

## 2023-10-28 DIAGNOSIS — M25562 Pain in left knee: Secondary | ICD-10-CM | POA: Diagnosis not present

## 2023-10-28 DIAGNOSIS — R600 Localized edema: Secondary | ICD-10-CM

## 2023-10-28 NOTE — Therapy (Signed)
 OUTPATIENT PHYSICAL THERAPY LOWER EXTREMITY TREATMENT   Patient Name: Antonio Holmes MRN: [id number] DOB:Oct 16, 1960, 63 y.o., male Today's Date: 10/28/2023  END OF SESSION:  PT End of Session - 10/28/23 1523     Visit Number 7    Number of Visits 12    Date for PT Re-Evaluation 11/15/23    PT Start Time 0315    PT Stop Time 0343    PT Time Calculation (min) 28 min    Activity Tolerance Patient tolerated treatment well    Behavior During Therapy [facility] for tasks assessed/performed                 Past Medical History:  Diagnosis Date   Asthma    Cough    Past Surgical History:  Procedure Laterality Date   CATARACT EXTRACTION W/PHACO Left 12/11/2022   Procedure: CATARACT EXTRACTION PHACO AND INTRAOCULAR LENS PLACEMENT (IOC);  Surgeon: Fabio Pierce, MD;  Location: [facility];  Service: Ophthalmology;  Laterality: Left;  CDE:9.97   CATARACT EXTRACTION W/PHACO Right 12/25/2022   Procedure: CATARACT EXTRACTION PHACO AND INTRAOCULAR LENS PLACEMENT (IOC);  Surgeon: Fabio Pierce, MD;  Location: [facility];  Service: Ophthalmology;  Laterality: Right;  CDE: 9.83   KNEE SURGERY     WISDOM TOOTH EXTRACTION     Patient Active Problem List   Diagnosis Date Noted   Gastroesophageal reflux disease 03/26/2023   Rash and other nonspecific skin eruption 08/13/2020   Left knee pain 12/05/2019   Allergic conjunctivitis 02/28/2019   Asthma with acute exacerbation 06/22/2016   Seasonal and perennial allergic rhinitis 02/17/2016   History of esophageal reflux 02/17/2016   Cough, persistent 02/17/2016   Not well controlled moderate persistent asthma 09/20/2013    REFERRING PROVIDER: Teryl Lucy MD  REFERRING DIAG: S/p left partial knee replacement.  THERAPY DIAG:  Chronic pain of left knee  Localized edema  Stiffness of left knee, not elsewhere classified  Rationale for Evaluation and Treatment: Rehabilitation  ONSET DATE: 09/30/23 (surgery date).  SUBJECTIVE:   SUBJECTIVE  STATEMENT: Doing good.  PERTINENT HISTORY: Unremarkable. PAIN:  Are you having pain? No  PRECAUTIONS: Other: No ultrasound.   WEIGHT BEARING RESTRICTIONS: No  FALLS:  Has patient fallen in last 6 months? No  LIVING ENVIRONMENT: Lives with: lives with their spouse Lives in: House/apartment Has following equipment at home: None   PLOF: Independent   OBJECTIVE:  Note: Objective measures were completed at Evaluation unless otherwise noted.   EDEMA:  Circumferential: 4 cms greater on left than right.   PALPATION: No significant areas of tenderness today.  LOWER EXTREMITY ROM:  In supine, left knee extension is -5 degrees and seated flexion is 90 degrees.  LOWER EXTREMITY ROM:     Active/Passive  10/28/23                           Knee flexion 125(P)   Knee extension      LOWER EXTREMITY MMT:  Left hip flexion and and knee extension is a solid 4+/5.  GAIT: Mild stiff-legged gait pattern.  TREATMENT DATE:  10/28/23:                                       EXERCISE LOG  Exercise Repetitions and Resistance Comments  Recumbent bike Level 3 beginning at seat 3 and ending at seat 2 x 15 minutes.    Knee ext 10# x 3 minutes   Ham curls 30# x 3 minutes   Leg Press  2 plates x 3 minutes                                          EXERCISE LOG  Exercise Repetitions and Resistance Comments                       Blank cell = exercise not performed today                                    10/26/23 EXERCISE LOG  Exercise Repetitions and Resistance Comments  Nustep     Recumbent bike  L3 x 15 minutes; seat 3   Rocker board  6 minutes    Forward Lunges 14" box x 3 mins   Work simulation    LAQs 3# x 30 reps bil   Seated Marches 3# x 30 reps bil   Seated Ham Curls Green x 25 reps bil   Seated hip ADD isometric     Sit to stand    Without UE support    Blank cell = exercise not performed today                                     10/12/23 EXERCISE LOG  Exercise Repetitions and Resistance Comments  Nustep  L4 x 15 minutes; seat 8-7   Rocker board  5 minutes   Standing HS stretch  4 x 30 seconds  LLE only  Marching on foam 3.5 minutes    Lunges onto step  14" step x 3 minutes    Step up  6" step x 25 reps  Leading with LLE    Blank cell = exercise not performed today                                    10/07/23 EXERCISE LOG  Exercise Repetitions and Resistance Comments  Nustep  L3 x 16 minutes; seat 10-9   Rocker board  5 minutes   Lunges onto step  8" step x 3 minutes  LLE on step   LAQ  2 minutes  LLE only   Seated HS curl  Green t-band x 3 minutes    Blank cell = exercise not performed today  Modalities: no adverse reaction to today's modalities  Date:  Vaso: Knee, 34 degrees; low pressure, 15 mins, Pain and Edema  PATIENT EDUCATION:  Education details: healing and expectation for soreness  Person educated: patient  Education method: verbal Education comprehension: patient reported understanding  HOME EXERCISE PROGRAM:   ASSESSMENT:  CLINICAL IMPRESSION: Patient did great today and progressed to seat 2 on the recumbent bike  and performed weight machines with excellent technique and without complaint.  He achieved passive flexion to 125 degrees.  OBJECTIVE IMPAIRMENTS: decreased activity tolerance, decreased ROM, decreased strength, increased edema, and pain.   ACTIVITY LIMITATIONS: locomotion level  PARTICIPATION LIMITATIONS: cleaning and yard work  [facility] POTENTIAL: Excellent  CLINICAL DECISION MAKING: Stable/uncomplicated  EVALUATION COMPLEXITY: Low   GOALS:  LONG TERM GOALS: Target date: 11/15/23.  Ind with a HEP.  Goal status: IN PROGRESS  2.  Full active left knee extension. Goal status: MET  3.  Active knee flexion to 120 degrees+ so the patient can perform functional tasks  and do so with pain not > 2-3/10.   3/4: 127 degrees Goal status: MET  4.  Increase left hip and knee strength to a solid 5/5 to provide good stability for accomplishment of functional activities.  Goal status: IN PROGRESS  5.  Decrease edema to within 2 cms of non-affected side to assist with pain reduction and range of motion gains.   3/4: 3.5 cms difference Goal status: IN PROGRESS  6.  Perform a reciprocating stair gait with one railing with pain not > 2-3/10.  Goal status: MET   PLAN:  PT FREQUENCY: 2x/week  PT DURATION: 6 weeks  PLANNED INTERVENTIONS: 97110-Therapeutic exercises, 97530- Therapeutic activity, [id number]- Neuromuscular re-education, 97535- Self Care, 09604- Manual therapy, 97014- Electrical stimulation (unattended), 97016- Vasopneumatic device, Patient/Family education, Stair training, Cryotherapy, and Moist heat  PLAN FOR NEXT SESSION: Nustep, progress to recumbent bike, left LE ROM.  LE elevation and vasopneumatic.     Lania Zawistowski, [place], PT 10/28/2023, 3:55 PM

## 2023-11-02 ENCOUNTER — Ambulatory Visit: Admitting: *Deleted

## 2023-11-02 DIAGNOSIS — M25662 Stiffness of left knee, not elsewhere classified: Secondary | ICD-10-CM

## 2023-11-02 DIAGNOSIS — M25562 Pain in left knee: Secondary | ICD-10-CM | POA: Diagnosis not present

## 2023-11-02 DIAGNOSIS — R600 Localized edema: Secondary | ICD-10-CM

## 2023-11-02 DIAGNOSIS — G8929 Other chronic pain: Secondary | ICD-10-CM

## 2023-11-02 NOTE — Therapy (Signed)
 OUTPATIENT PHYSICAL THERAPY LOWER EXTREMITY TREATMENT   Patient Name: Antonio Holmes MRN: [id number] DOB:Aug 05, 1961, 63 y.o., male Today's Date: 11/02/2023  END OF SESSION:  PT End of Session - 11/02/23 1513     Visit Number 8    Number of Visits 12    Date for PT Re-Evaluation 11/15/23    PT Start Time 1514    PT Stop Time 1555    PT Time Calculation (min) 41 min                 Past Medical History:  Diagnosis Date   Asthma    Cough    Past Surgical History:  Procedure Laterality Date   CATARACT EXTRACTION W/PHACO Left 12/11/2022   Procedure: CATARACT EXTRACTION PHACO AND INTRAOCULAR LENS PLACEMENT (IOC);  Surgeon: Fabio Pierce, MD;  Location: [facility];  Service: Ophthalmology;  Laterality: Left;  CDE:9.97   CATARACT EXTRACTION W/PHACO Right 12/25/2022   Procedure: CATARACT EXTRACTION PHACO AND INTRAOCULAR LENS PLACEMENT (IOC);  Surgeon: Fabio Pierce, MD;  Location: [facility];  Service: Ophthalmology;  Laterality: Right;  CDE: 9.83   KNEE SURGERY     WISDOM TOOTH EXTRACTION     Patient Active Problem List   Diagnosis Date Noted   Gastroesophageal reflux disease 03/26/2023   Rash and other nonspecific skin eruption 08/13/2020   Left knee pain 12/05/2019   Allergic conjunctivitis 02/28/2019   Asthma with acute exacerbation 06/22/2016   Seasonal and perennial allergic rhinitis 02/17/2016   History of esophageal reflux 02/17/2016   Cough, persistent 02/17/2016   Not well controlled moderate persistent asthma 09/20/2013    REFERRING PROVIDER: Teryl Lucy MD  REFERRING DIAG: S/p left partial knee replacement.  THERAPY DIAG:  Chronic pain of left knee  Localized edema  Stiffness of left knee, not elsewhere classified  Rationale for Evaluation and Treatment: Rehabilitation  ONSET DATE: 09/30/23 (surgery date).  SUBJECTIVE:   SUBJECTIVE STATEMENT: Doing good. LT knee okay  sore 2/10.   PERTINENT HISTORY: Unremarkable. PAIN:  Are you having pain?  No  PRECAUTIONS: Other: No ultrasound.   WEIGHT BEARING RESTRICTIONS: No  FALLS:  Has patient fallen in last 6 months? No  LIVING ENVIRONMENT: Lives with: lives with their spouse Lives in: House/apartment Has following equipment at home: None   PLOF: Independent   OBJECTIVE:  Note: Objective measures were completed at Evaluation unless otherwise noted.   EDEMA:  Circumferential: 4 cms greater on left than right.   PALPATION: No significant areas of tenderness today.  LOWER EXTREMITY ROM:  In supine, left knee extension is -5 degrees and seated flexion is 90 degrees.  LOWER EXTREMITY ROM:     Active/Passive  10/28/23                           Knee flexion 125(P)   Knee extension      LOWER EXTREMITY MMT:  Left hip flexion and and knee extension is a solid 4+/5.  GAIT: Mild stiff-legged gait pattern.  TREATMENT DATE:  11/02/23:                                       EXERCISE LOG    LT knee  Exercise Repetitions and Resistance Comments  Recumbent bike Level 3 beginning at seat 3 and ending at seat 2 x 15 minutes.    6in stepup side 3x10 each way   LT knee   Knee ext 10# x 4 minutes   Ham curls 30# x 4 minutes   Leg Press  2 plates x 3 minutes                                          EXERCISE LOG  Exercise Repetitions and Resistance Comments                       Blank cell = exercise not performed today                                    10/26/23 EXERCISE LOG  Exercise Repetitions and Resistance Comments  Nustep     Recumbent bike  L3 x 15 minutes; seat 3   Rocker board  6 minutes    Forward Lunges 14" box x 3 mins   Work simulation    LAQs 3# x 30 reps bil   Seated Marches 3# x 30 reps bil   Seated Ham Curls Green x 25 reps bil   Seated hip ADD isometric     Sit to stand   Without UE support    Blank cell =  exercise not performed today                                     10/12/23 EXERCISE LOG  Exercise Repetitions and Resistance Comments  Nustep  L4 x 15 minutes; seat 8-7   Rocker board  5 minutes   Standing HS stretch  4 x 30 seconds  LLE only  Marching on foam 3.5 minutes    Lunges onto step  14" step x 3 minutes    Step up  6" step x 25 reps  Leading with LLE    Blank cell = exercise not performed today                                    10/07/23 EXERCISE LOG  Exercise Repetitions and Resistance Comments  Nustep  L3 x 16 minutes; seat 10-9   Rocker board  5 minutes   Lunges onto step  8" step x 3 minutes  LLE on step   LAQ  2 minutes  LLE only   Seated HS curl  Green t-band x 3 minutes    Blank cell = exercise not performed today  Modalities: no adverse reaction to today's modalities  Date:  Vaso: Knee, 34 degrees; low pressure, 15 mins, Pain and Edema  PATIENT EDUCATION:  Education details: healing and expectation for soreness  Person educated: patient  Education method: verbal Education comprehension: patient reported understanding  HOME EXERCISE PROGRAM:  ASSESSMENT:  CLINICAL IMPRESSION: Patient arrived today 2/10 LT knee. He was able to continue with [facility] and [facility] exs today for LT knee / LE strengthening with mainly fatigue end of session. DC after next visit     OBJECTIVE IMPAIRMENTS: decreased activity tolerance, decreased ROM, decreased strength, increased edema, and pain.   ACTIVITY LIMITATIONS: locomotion level  PARTICIPATION LIMITATIONS: cleaning and yard work  [facility] POTENTIAL: Excellent  CLINICAL DECISION MAKING: Stable/uncomplicated  EVALUATION COMPLEXITY: Low   GOALS:  LONG TERM GOALS: Target date: 11/15/23.  Ind with a HEP.  Goal status: IN PROGRESS  2.  Full active left knee extension. Goal status: MET  3.  Active knee flexion to 120 degrees+ so the patient can perform functional tasks and do so with pain not > 2-3/10.   3/4: 127  degrees Goal status: MET  4.  Increase left hip and knee strength to a solid 5/5 to provide good stability for accomplishment of functional activities.  Goal status: IN PROGRESS  5.  Decrease edema to within 2 cms of non-affected side to assist with pain reduction and range of motion gains.   3/4: 3.5 cms difference Goal status: IN PROGRESS  6.  Perform a reciprocating stair gait with one railing with pain not > 2-3/10.  Goal status: MET   PLAN:  PT FREQUENCY: 2x/week  PT DURATION: 6 weeks  PLANNED INTERVENTIONS: 97110-Therapeutic exercises, 97530- Therapeutic activity, [id number]- Neuromuscular re-education, 97535- Self Care, 29562- Manual therapy, 97014- Electrical stimulation (unattended), 97016- Vasopneumatic device, Patient/Family education, Stair training, Cryotherapy, and Moist heat  PLAN FOR NEXT SESSION: Nustep, progress to recumbent bike, left LE ROM.  LE elevation and vasopneumatic.     Ousmane Seeman,CHRIS, PTA 11/02/2023, 3:55 PM

## 2023-11-04 ENCOUNTER — Ambulatory Visit: Admitting: Physical Therapy

## 2023-11-04 ENCOUNTER — Encounter: Payer: Self-pay | Admitting: Physical Therapy

## 2023-11-04 DIAGNOSIS — R600 Localized edema: Secondary | ICD-10-CM

## 2023-11-04 DIAGNOSIS — G8929 Other chronic pain: Secondary | ICD-10-CM

## 2023-11-04 DIAGNOSIS — M25562 Pain in left knee: Secondary | ICD-10-CM | POA: Diagnosis not present

## 2023-11-04 DIAGNOSIS — M25662 Stiffness of left knee, not elsewhere classified: Secondary | ICD-10-CM

## 2023-11-04 NOTE — Therapy (Signed)
 OUTPATIENT PHYSICAL THERAPY LOWER EXTREMITY TREATMENT   Patient Name: Antonio Holmes MRN: [id number] DOB:11/22/1960, 63 y.o., male Today's Date: 11/04/2023  END OF SESSION:  PT End of Session - 11/04/23 1544     Visit Number 9    Number of Visits 12    Date for PT Re-Evaluation 11/15/23    PT Start Time 0315    PT Stop Time 0359    PT Time Calculation (min) 44 min    Activity Tolerance Patient tolerated treatment well    Behavior During Therapy [facility] for tasks assessed/performed                 Past Medical History:  Diagnosis Date   Asthma    Cough    Past Surgical History:  Procedure Laterality Date   CATARACT EXTRACTION W/PHACO Left 12/11/2022   Procedure: CATARACT EXTRACTION PHACO AND INTRAOCULAR LENS PLACEMENT (IOC);  Surgeon: Fabio Pierce, MD;  Location: [facility];  Service: Ophthalmology;  Laterality: Left;  CDE:9.97   CATARACT EXTRACTION W/PHACO Right 12/25/2022   Procedure: CATARACT EXTRACTION PHACO AND INTRAOCULAR LENS PLACEMENT (IOC);  Surgeon: Fabio Pierce, MD;  Location: [facility];  Service: Ophthalmology;  Laterality: Right;  CDE: 9.83   KNEE SURGERY     WISDOM TOOTH EXTRACTION     Patient Active Problem List   Diagnosis Date Noted   Gastroesophageal reflux disease 03/26/2023   Rash and other nonspecific skin eruption 08/13/2020   Left knee pain 12/05/2019   Allergic conjunctivitis 02/28/2019   Asthma with acute exacerbation 06/22/2016   Seasonal and perennial allergic rhinitis 02/17/2016   History of esophageal reflux 02/17/2016   Cough, persistent 02/17/2016   Not well controlled moderate persistent asthma 09/20/2013    REFERRING PROVIDER: Teryl Lucy MD  REFERRING DIAG: S/p left partial knee replacement.  THERAPY DIAG:  Chronic pain of left knee  Localized edema  Stiffness of left knee, not elsewhere classified  Rationale for Evaluation and Treatment: Rehabilitation  ONSET DATE: 09/30/23 (surgery date).  SUBJECTIVE:   SUBJECTIVE  STATEMENT: Doing good. PERTINENT HISTORY: Unremarkable. PAIN:  Are you having pain? 2/10.  PRECAUTIONS: Other: No ultrasound.   WEIGHT BEARING RESTRICTIONS: No  FALLS:  Has patient fallen in last 6 months? No  LIVING ENVIRONMENT: Lives with: lives with their spouse Lives in: House/apartment Has following equipment at home: None   PLOF: Independent   OBJECTIVE:  Note: Objective measures were completed at Evaluation unless otherwise noted.   EDEMA:  Circumferential: 4 cms greater on left than right.   PALPATION: No significant areas of tenderness today.  LOWER EXTREMITY ROM:  In supine, left knee extension is -5 degrees and seated flexion is 90 degrees.  LOWER EXTREMITY ROM:     Active/Passive  10/28/23                           Knee flexion 125(P)   Knee extension      LOWER EXTREMITY MMT:  Left hip flexion and and knee extension is a solid 4+/5.  GAIT: Mild stiff-legged gait pattern.  TREATMENT DATE:  11/04/23:                                     EXERCISE LOG  Exercise Repetitions and Resistance Comments  Nustep Level 5 x 10 minutes   Recumbent bike Level 3 x 10 minutes   Elliptical L2/2 10 minutes (5 mins for and 5 mins back)   Knee ext 10# x 3 minutes   Ham curls 40# x 3 minutes   Leg Press 2 1/2 plats x 3 minutes      11/02/23:                                       EXERCISE LOG    LT knee  Exercise Repetitions and Resistance Comments  Recumbent bike Level 3 beginning at seat 3 and ending at seat 2 x 15 minutes.    6in stepup side 3x10 each way   LT knee   Knee ext 10# x 4 minutes   Ham curls 30# x 4 minutes   Leg Press  2 plates x 3 minutes                                                                  10/26/23 EXERCISE LOG  Exercise Repetitions and Resistance Comments  Nustep     Recumbent bike  L3 x  15 minutes; seat 3   Rocker board  6 minutes    Forward Lunges 14" box x 3 mins   Work simulation    LAQs 3# x 30 reps bil   Seated Marches 3# x 30 reps bil   Seated Ham Curls Green x 25 reps bil   Seated hip ADD isometric     Sit to stand   Without UE support    Blank cell = exercise not performed today                                     10/12/23 EXERCISE LOG  Exercise Repetitions and Resistance Comments  Nustep  L4 x 15 minutes; seat 8-7   Rocker board  5 minutes   Standing HS stretch  4 x 30 seconds  LLE only  Marching on foam 3.5 minutes    Lunges onto step  14" step x 3 minutes    Step up  6" step x 25 reps  Leading with LLE    Blank cell = exercise not performed today                                    10/07/23 EXERCISE LOG  Exercise Repetitions and Resistance Comments  Nustep  L3 x 16 minutes; seat 10-9   Rocker board  5 minutes   Lunges onto step  8" step x 3 minutes  LLE on step   LAQ  2 minutes  LLE only   Seated HS curl  Green t-band x 3 minutes    Blank cell =  exercise not performed today  Modalities: no adverse reaction to today's modalities  Date:  Vaso: Knee, 34 degrees; low pressure, 15 mins, Pain and Edema  PATIENT EDUCATION:  Education details: healing and expectation for soreness  Person educated: patient  Education method: verbal Education comprehension: patient reported understanding  HOME EXERCISE PROGRAM:   ASSESSMENT:  CLINICAL IMPRESSION: Patient is  highly motivated.  Added elliptical today.  Patient performed all exercises with excellent technique.    OBJECTIVE IMPAIRMENTS: decreased activity tolerance, decreased ROM, decreased strength, increased edema, and pain.   ACTIVITY LIMITATIONS: locomotion level  PARTICIPATION LIMITATIONS: cleaning and yard work  [facility] POTENTIAL: Excellent  CLINICAL DECISION MAKING: Stable/uncomplicated  EVALUATION COMPLEXITY: Low   GOALS:  LONG TERM GOALS: Target date: 11/15/23.  Ind with a HEP.   Goal status: IN PROGRESS  2.  Full active left knee extension. Goal status: MET  3.  Active knee flexion to 120 degrees+ so the patient can perform functional tasks and do so with pain not > 2-3/10.   3/4: 127 degrees Goal status: MET  4.  Increase left hip and knee strength to a solid 5/5 to provide good stability for accomplishment of functional activities.  Goal status: MET.  5.  Decrease edema to within 2 cms of non-affected side to assist with pain reduction and range of motion gains.   3/4: 3.5 cms difference Goal status: MET  6.  Perform a reciprocating stair gait with one railing with pain not > 2-3/10.  Goal status: MET   PLAN:  PT FREQUENCY: 2x/week  PT DURATION: 6 weeks  PLANNED INTERVENTIONS: 97110-Therapeutic exercises, 97530- Therapeutic activity, [id number]- Neuromuscular re-education, 97535- Self Care, 16109- Manual therapy, 97014- Electrical stimulation (unattended), 97016- Vasopneumatic device, Patient/Family education, Stair training, Cryotherapy, and Moist heat  PLAN FOR NEXT SESSION: Nustep, progress to recumbent bike, left LE ROM.  LE elevation and vasopneumatic.    PHYSICAL THERAPY DISCHARGE SUMMARY  Visits from Start of Care: 9.  Current functional level related to goals / functional outcomes: See above.   Remaining deficits: All goals met.   Education / Equipment: HEP.   Patient agrees to discharge. Patient goals were met. Patient is being discharged due to meeting the stated rehab goals.  Dietra Stokely, [place], PT 11/04/2023, 4:00 PM

## 2024-04-12 ENCOUNTER — Ambulatory Visit (INDEPENDENT_AMBULATORY_CARE_PROVIDER_SITE_OTHER): Payer: 59 | Admitting: Allergy & Immunology

## 2024-04-12 ENCOUNTER — Other Ambulatory Visit: Payer: Self-pay

## 2024-04-12 VITALS — BP 100/60 | HR 94 | Temp 98.1°F

## 2024-04-12 DIAGNOSIS — Z91038 Other insect allergy status: Secondary | ICD-10-CM | POA: Diagnosis not present

## 2024-04-12 DIAGNOSIS — J4540 Moderate persistent asthma, uncomplicated: Secondary | ICD-10-CM | POA: Diagnosis not present

## 2024-04-12 DIAGNOSIS — K219 Gastro-esophageal reflux disease without esophagitis: Secondary | ICD-10-CM

## 2024-04-12 DIAGNOSIS — J3089 Other allergic rhinitis: Secondary | ICD-10-CM | POA: Diagnosis not present

## 2024-04-12 DIAGNOSIS — J302 Other seasonal allergic rhinitis: Secondary | ICD-10-CM

## 2024-04-12 MED ORDER — PULMICORT FLEXHALER 90 MCG/ACT IN AEPB: 2.0000 | INHALATION_SPRAY | Freq: Two times a day (BID) | RESPIRATORY_TRACT | 1 refills | Status: AC

## 2024-04-12 MED ORDER — MONTELUKAST SODIUM 10 MG PO TABS: ORAL_TABLET | ORAL | 3 refills | Status: AC

## 2024-04-12 MED ORDER — FAMOTIDINE 20 MG PO TABS: 20.0000 mg | ORAL_TABLET | Freq: Two times a day (BID) | ORAL | 1 refills | Status: AC

## 2024-04-12 MED ORDER — LEVOCETIRIZINE DIHYDROCHLORIDE 5 MG PO TABS: 5.0000 mg | ORAL_TABLET | Freq: Every evening | ORAL | 1 refills | Status: AC

## 2024-04-12 MED ORDER — ALBUTEROL SULFATE HFA 108 (90 BASE) MCG/ACT IN AERS: 2.0000 | INHALATION_SPRAY | Freq: Four times a day (QID) | RESPIRATORY_TRACT | 2 refills | Status: AC | PRN

## 2024-04-12 NOTE — Patient Instructions (Addendum)
 Allergic rhinitis (grasses, weeds, ragweed, trees, molds, dust mites, cat, dog, cockroach) - You seem to have an excellent handle on your symptoms.  - Continue montelukast  10 mg once a day to control allergy  symptoms. - Continue with the Flonase  2 sprays in each nostril once a day as needed for runny nose during the worst seasons.  - Consider saline nasal rinses as needed for nasal symptoms.  - We can always re-test you in the future if needed.   Asthma - Lung testing looks stellar today. - Let's decease Pulmicort  to ONCE daily.  - We are not going to make any medication changes at this point.  - Daily controller medication(s): Pulmicort  Flexhaler 90mcg 2 puffs ONCE daily - Prior to physical activity: albuterol  2 puffs 10-15 minutes before physical activity. - Rescue medications: albuterol  4 puffs every 4-6 hours as needed - Changes during respiratory infections or worsening symptoms: Increase Pulmicort  to 2 puffs twice daily for ONE TO TWO WEEKS. - Asthma control goals:  * Full participation in all desired activities (may need albuterol  before activity) * Albuterol  use two time or less a week on average (not counting use with activity) * Cough interfering with sleep two time or less a month * Oral steroids no more than once a year * No hospitalizations  Reflux - Continue dietary and lifestyle modifications. - Continue famotidine  20 mg twice a day to prevent reflux.  Stinging insect allergy  - EpiPen  is updated.  - Continue to avoid stinging insects.   Return in about 6 months (around 10/13/2024). You can have the follow up appointment with Dr. Iva or a Nurse Practicioner (our Nurse Practitioners are excellent and always have Physician oversight!).    Please inform us  of any Emergency Department visits, hospitalizations, or changes in symptoms. Call us  before going to the ED for breathing or allergy  symptoms since we might be able to fit you in for a sick visit. Feel free to  contact us  anytime with any questions, problems, or concerns.  It was a pleasure to see you again today!  Websites that have reliable patient information: 1. American Academy of Asthma, Allergy , and Immunology: www.aaaai.org 2. Food Allergy  Research and Education (FARE): foodallergy.org 3. Mothers of Asthmatics: http://www.asthmacommunitynetwork.org 4. [employer] of Allergy , Asthma, and Immunology: www.acaai.org      "Like" us  on Facebook and Instagram for our latest updates!      A healthy democracy works best when [employer] participate! Make sure you are registered to vote! If you have moved or changed any of your contact information, you will need to get this updated before voting! Scan the QR codes below to learn more!

## 2024-04-12 NOTE — Progress Notes (Unsigned)
 FOLLOW UP  Date of Service/Encounter:  04/12/24   Assessment:   Moderate persistent asthma without complication - now asymptomatic without medications likely secondary to allergic rhinitis control and GERD control   Seasonal and perennial allergic rhinitis - on allergen immunotherapy (maintenance reached April 2018) - now s/p 5 years of allergen immunotherapy    GERD - on PPI + H2 blocker with good control    Plan/Recommendations:   Allergic rhinitis (grasses, weeds, ragweed, trees, molds, dust mites, cat, dog, cockroach) - You seem to have an excellent handle on your symptoms.  - Continue montelukast  10 mg once a day to control allergy  symptoms. - Continue with the Flonase  2 sprays in each nostril once a day as needed for runny nose during the worst seasons.  - Consider saline nasal rinses as needed for nasal symptoms.  - We can always re-test you in the future if needed.   Asthma - Lung testing looks stellar today. - Let's decease Pulmicort  to ONCE daily.  - We are not going to make any medication changes at this point.  - Daily controller medication(s): Pulmicort  Flexhaler 90mcg 2 puffs ONCE daily - Prior to physical activity: albuterol  2 puffs 10-15 minutes before physical activity. - Rescue medications: albuterol  4 puffs every 4-6 hours as needed - Changes during respiratory infections or worsening symptoms: Increase Pulmicort  to 2 puffs twice daily for ONE TO TWO WEEKS. - Asthma control goals:  * Full participation in all desired activities (may need albuterol  before activity) * Albuterol  use two time or less a week on average (not counting use with activity) * Cough interfering with sleep two time or less a month * Oral steroids no more than once a year * No hospitalizations  Reflux - Continue dietary and lifestyle modifications. - Continue famotidine  20 mg twice a day to prevent reflux.  Stinging insect allergy  - EpiPen  is updated.  - Continue to avoid stinging  insects.   Return in about 6 months (around 10/13/2024). You can have the follow up appointment with Dr. Iva or a Nurse Practicioner (our Nurse Practitioners are excellent and always have Physician oversight!).     Subjective:   Antonio Holmes is a 63 y.o. male presenting today for follow up of  Chief Complaint  Patient presents with   Follow-up    Allergies Asthma     Antonio Holmes has a history of the following: Patient Active Problem List   Diagnosis Date Noted   Gastroesophageal reflux disease 03/26/2023   Rash and other nonspecific skin eruption 08/13/2020   Left knee pain 12/05/2019   Allergic conjunctivitis 02/28/2019   Asthma with acute exacerbation 06/22/2016   Seasonal and perennial allergic rhinitis 02/17/2016   History of esophageal reflux 02/17/2016   Cough, persistent 02/17/2016   Not well controlled moderate persistent asthma 09/20/2013    History obtained from: chart review and {Persons; PED relatives w/patient:19415::patient}.  Discussed the use of AI scribe software for clinical note transcription with the patient and/or guardian, who gave verbal consent to proceed.  Antonio Holmes is a 63 y.o. male presenting for {Blank single:19197::a food challenge,a drug challenge,skin testing,a sick visit,an evaluation of ***,a follow up visit}.  He was last seen in February 2025.  At that time, allergies were under good control with montelukast  as well as Flonase .  For his asthma, he was doing well with Pulmicort  90 mcg 1 puff twice daily and albuterol  as needed.  He continued on famotidine  twice a day for his reflux.  His EpiPen  was updated for stinging insect allergy .  Since last visit, he has done well.   Asthma/Respiratory Symptom History: ***  Allergic Rhinitis Symptom History: ***  Food Allergy  Symptom History: ***  Skin Symptom History: ***  GERD Symptom History: ***  Infection Symptom History: ***  Otherwise, there have been no changes  to his past medical history, surgical history, family history, or social history.    Review of systems otherwise negative other than that mentioned in the HPI.    Objective:   Blood pressure 100/60, pulse 94, temperature 98.1 F (36.7 C), SpO2 96%. There is no height or weight on file to calculate BMI.    Physical Exam   Diagnostic studies:    Spirometry: results normal (FEV1: 1.48/53%, FVC: 3.18/88%, FEV1/FVC: 47%).    Spirometry consistent with moderate obstructive disease. {Blank single:19197::Albuterol /Atrovent nebulizer,Xopenex/Atrovent nebulizer,Albuterol  nebulizer,Albuterol  four puffs via MDI,Xopenex four puffs via MDI} treatment given in clinic with {Blank single:19197::significant improvement in FEV1 per ATS criteria,significant improvement in FVC per ATS criteria,significant improvement in FEV1 and FVC per ATS criteria,improvement in FEV1, but not significant per ATS criteria,improvement in FVC, but not significant per ATS criteria,improvement in FEV1 and FVC, but not significant per ATS criteria,no improvement}.  Allergy  Studies: {Blank single:19197::none,deferred due to recent antihistamine use,deferred due to insurance stipulations that require a separate visit for testing,labs sent instead, }    {Blank single:19197::Allergy  testing results were read and interpreted by myself, documented by clinical staff., }      Antonio Shaggy, MD  [facility] 

## 2024-04-14 ENCOUNTER — Encounter: Payer: Self-pay | Admitting: Allergy & Immunology

## 2024-05-30 ENCOUNTER — Encounter: Payer: Self-pay | Admitting: Allergy & Immunology

## 2024-05-31 MED ORDER — ASMANEX HFA 100 MCG/ACT IN AERO: 2.0000 | INHALATION_SPRAY | Freq: Every morning | RESPIRATORY_TRACT | 5 refills | Status: AC

## 2024-06-12 ENCOUNTER — Other Ambulatory Visit: Payer: Self-pay | Admitting: Orthopedic Surgery

## 2024-07-17 ENCOUNTER — Encounter (HOSPITAL_BASED_OUTPATIENT_CLINIC_OR_DEPARTMENT_OTHER): Payer: Self-pay | Admitting: Orthopedic Surgery

## 2024-07-24 ENCOUNTER — Encounter (HOSPITAL_BASED_OUTPATIENT_CLINIC_OR_DEPARTMENT_OTHER)
Admission: RE | Admit: 2024-07-24 | Discharge: 2024-07-24 | Disposition: A | Source: Ambulatory Visit | Attending: Orthopedic Surgery

## 2024-07-24 DIAGNOSIS — Z01812 Encounter for preprocedural laboratory examination: Secondary | ICD-10-CM | POA: Insufficient documentation

## 2024-07-24 DIAGNOSIS — Z419 Encounter for procedure for purposes other than remedying health state, unspecified: Secondary | ICD-10-CM | POA: Insufficient documentation

## 2024-07-24 LAB — SURGICAL PCR SCREEN

## 2024-07-24 NOTE — Progress Notes (Signed)

## 2024-07-25 NOTE — Progress Notes (Signed)
 Surgical PCR result invalid. Will need to be recollected DOS.

## 2024-07-26 ENCOUNTER — Encounter (HOSPITAL_BASED_OUTPATIENT_CLINIC_OR_DEPARTMENT_OTHER)
Admission: RE | Admit: 2024-07-26 | Discharge: 2024-07-26 | Disposition: A | Source: Ambulatory Visit | Attending: Orthopedic Surgery | Admitting: Orthopedic Surgery

## 2024-07-26 DIAGNOSIS — Z87891 Personal history of nicotine dependence: Secondary | ICD-10-CM | POA: Diagnosis not present

## 2024-07-26 DIAGNOSIS — M25371 Other instability, right ankle: Secondary | ICD-10-CM | POA: Diagnosis present

## 2024-07-26 DIAGNOSIS — K219 Gastro-esophageal reflux disease without esophagitis: Secondary | ICD-10-CM | POA: Diagnosis not present

## 2024-07-26 DIAGNOSIS — X58XXXS Exposure to other specified factors, sequela: Secondary | ICD-10-CM | POA: Diagnosis not present

## 2024-07-26 DIAGNOSIS — T1490XS Injury, unspecified, sequela: Secondary | ICD-10-CM | POA: Diagnosis not present

## 2024-07-26 DIAGNOSIS — Z7951 Long term (current) use of inhaled steroids: Secondary | ICD-10-CM | POA: Diagnosis not present

## 2024-07-26 DIAGNOSIS — M19171 Post-traumatic osteoarthritis, right ankle and foot: Secondary | ICD-10-CM | POA: Diagnosis not present

## 2024-07-26 DIAGNOSIS — M6701 Short Achilles tendon (acquired), right ankle: Secondary | ICD-10-CM | POA: Diagnosis not present

## 2024-07-26 DIAGNOSIS — J45909 Unspecified asthma, uncomplicated: Secondary | ICD-10-CM | POA: Diagnosis not present

## 2024-07-26 DIAGNOSIS — Z79899 Other long term (current) drug therapy: Secondary | ICD-10-CM | POA: Diagnosis not present

## 2024-07-26 LAB — SURGICAL PCR SCREEN
MRSA, PCR: NEGATIVE
Staphylococcus aureus: NEGATIVE

## 2024-07-27 ENCOUNTER — Encounter (HOSPITAL_BASED_OUTPATIENT_CLINIC_OR_DEPARTMENT_OTHER): Admission: RE | Disposition: A | Payer: Self-pay | Source: Home / Self Care | Attending: Orthopedic Surgery

## 2024-07-27 ENCOUNTER — Ambulatory Visit (HOSPITAL_BASED_OUTPATIENT_CLINIC_OR_DEPARTMENT_OTHER): Payer: Self-pay | Admitting: Anesthesiology

## 2024-07-27 ENCOUNTER — Encounter (HOSPITAL_BASED_OUTPATIENT_CLINIC_OR_DEPARTMENT_OTHER): Payer: Self-pay | Admitting: Orthopedic Surgery

## 2024-07-27 ENCOUNTER — Encounter (HOSPITAL_BASED_OUTPATIENT_CLINIC_OR_DEPARTMENT_OTHER): Payer: Self-pay | Admitting: Anesthesiology

## 2024-07-27 ENCOUNTER — Ambulatory Visit (HOSPITAL_COMMUNITY)

## 2024-07-27 ENCOUNTER — Ambulatory Visit (HOSPITAL_BASED_OUTPATIENT_CLINIC_OR_DEPARTMENT_OTHER)
Admission: RE | Admit: 2024-07-27 | Discharge: 2024-07-27 | Disposition: A | Attending: Orthopedic Surgery | Admitting: Orthopedic Surgery

## 2024-07-27 DIAGNOSIS — Z7951 Long term (current) use of inhaled steroids: Secondary | ICD-10-CM | POA: Insufficient documentation

## 2024-07-27 DIAGNOSIS — Z79899 Other long term (current) drug therapy: Secondary | ICD-10-CM | POA: Insufficient documentation

## 2024-07-27 DIAGNOSIS — T1490XS Injury, unspecified, sequela: Secondary | ICD-10-CM | POA: Insufficient documentation

## 2024-07-27 DIAGNOSIS — X58XXXS Exposure to other specified factors, sequela: Secondary | ICD-10-CM | POA: Insufficient documentation

## 2024-07-27 DIAGNOSIS — Z01818 Encounter for other preprocedural examination: Secondary | ICD-10-CM

## 2024-07-27 DIAGNOSIS — J45909 Unspecified asthma, uncomplicated: Secondary | ICD-10-CM | POA: Insufficient documentation

## 2024-07-27 DIAGNOSIS — K219 Gastro-esophageal reflux disease without esophagitis: Secondary | ICD-10-CM | POA: Insufficient documentation

## 2024-07-27 DIAGNOSIS — Z87891 Personal history of nicotine dependence: Secondary | ICD-10-CM | POA: Insufficient documentation

## 2024-07-27 DIAGNOSIS — M25371 Other instability, right ankle: Secondary | ICD-10-CM | POA: Insufficient documentation

## 2024-07-27 DIAGNOSIS — M6701 Short Achilles tendon (acquired), right ankle: Secondary | ICD-10-CM | POA: Insufficient documentation

## 2024-07-27 DIAGNOSIS — M19171 Post-traumatic osteoarthritis, right ankle and foot: Secondary | ICD-10-CM | POA: Insufficient documentation

## 2024-07-27 DIAGNOSIS — Z419 Encounter for procedure for purposes other than remedying health state, unspecified: Secondary | ICD-10-CM

## 2024-07-27 HISTORY — PX: ACHILLES TENDON LENGTHENING: SHX6455

## 2024-07-27 HISTORY — PX: TOTAL ANKLE ARTHROPLASTY: SHX811

## 2024-07-27 SURGERY — ARTHROPLASTY, ANKLE, TOTAL
Anesthesia: General | Site: Foot | Laterality: Right

## 2024-07-27 MED ORDER — CEFAZOLIN SODIUM-DEXTROSE 2-4 GM/100ML-% IV SOLN
INTRAVENOUS | Status: AC
  Filled 2024-07-27: qty 100

## 2024-07-27 MED ORDER — VANCOMYCIN HCL 500 MG IV SOLR
INTRAVENOUS | Status: DC | PRN
  Administered 2024-07-27: 500 mg via TOPICAL

## 2024-07-27 MED ORDER — SURGIPHOR WOUND IRRIGATION SYSTEM - OPTIME
TOPICAL | Status: DC | PRN
  Administered 2024-07-27: 450 mL via TOPICAL

## 2024-07-27 MED ORDER — GLYCOPYRROLATE 0.2 MG/ML IJ SOLN
INTRAMUSCULAR | Status: DC | PRN
  Administered 2024-07-27: .2 mg via INTRAVENOUS

## 2024-07-27 MED ORDER — PHENYLEPHRINE HCL-NACL 20-0.9 MG/250ML-% IV SOLN
INTRAVENOUS | Status: DC | PRN
  Administered 2024-07-27: 40 ug/min via INTRAVENOUS

## 2024-07-27 MED ORDER — LIDOCAINE 2% (20 MG/ML) 5 ML SYRINGE
INTRAMUSCULAR | Status: DC | PRN
  Administered 2024-07-27: 100 mg via INTRAVENOUS

## 2024-07-27 MED ORDER — LACTATED RINGERS IV SOLN: INTRAVENOUS | Status: DC

## 2024-07-27 MED ORDER — OXYCODONE HCL 5 MG PO TABS: 5.0000 mg | ORAL_TABLET | Freq: Once | ORAL | Status: DC | PRN

## 2024-07-27 MED ORDER — PHENYLEPHRINE HCL (PRESSORS) 10 MG/ML IV SOLN
INTRAVENOUS | Status: DC | PRN
  Administered 2024-07-27: 160 ug via INTRAVENOUS
  Administered 2024-07-27: 120 ug via INTRAVENOUS
  Administered 2024-07-27 (×2): 160 ug via INTRAVENOUS

## 2024-07-27 MED ORDER — DROPERIDOL 2.5 MG/ML IJ SOLN: 0.6250 mg | Freq: Once | INTRAMUSCULAR | Status: DC | PRN

## 2024-07-27 MED ORDER — FENTANYL CITRATE (PF) 100 MCG/2ML IJ SOLN
INTRAMUSCULAR | Status: AC
  Filled 2024-07-27: qty 2

## 2024-07-27 MED ORDER — LIDOCAINE 2% (20 MG/ML) 5 ML SYRINGE
INTRAMUSCULAR | Status: AC
  Filled 2024-07-27: qty 5

## 2024-07-27 MED ORDER — OXYCODONE HCL 5 MG/5ML PO SOLN: 5.0000 mg | Freq: Once | ORAL | Status: DC | PRN

## 2024-07-27 MED ORDER — ACETAMINOPHEN 500 MG PO TABS
1000.0000 mg | ORAL_TABLET | Freq: Once | ORAL | Status: AC
  Administered 2024-07-27: 1000 mg via ORAL

## 2024-07-27 MED ORDER — MIDAZOLAM HCL (PF) 2 MG/2ML IJ SOLN
1.0000 mg | Freq: Once | INTRAMUSCULAR | Status: AC
  Administered 2024-07-27: 1 mg via INTRAVENOUS

## 2024-07-27 MED ORDER — BUPIVACAINE LIPOSOME 1.3 % IJ SUSP
INTRAMUSCULAR | Status: DC | PRN
  Administered 2024-07-27: 10 mL via PERINEURAL

## 2024-07-27 MED ORDER — 0.9 % SODIUM CHLORIDE (POUR BTL) OPTIME
TOPICAL | Status: DC | PRN
  Administered 2024-07-27: 1000 mL

## 2024-07-27 MED ORDER — DEXAMETHASONE SOD PHOSPHATE PF 10 MG/ML IJ SOLN
INTRAMUSCULAR | Status: DC | PRN
  Administered 2024-07-27: 8 mg via INTRAVENOUS

## 2024-07-27 MED ORDER — LACTATED RINGERS IV SOLN: INTRAVENOUS | Status: DC | PRN

## 2024-07-27 MED ORDER — CEFAZOLIN SODIUM-DEXTROSE 2-4 GM/100ML-% IV SOLN
2.0000 g | INTRAVENOUS | Status: AC
  Administered 2024-07-27: 2 g via INTRAVENOUS

## 2024-07-27 MED ORDER — GLYCOPYRROLATE PF 0.2 MG/ML IJ SOSY
PREFILLED_SYRINGE | INTRAMUSCULAR | Status: AC
  Filled 2024-07-27: qty 1

## 2024-07-27 MED ORDER — BUPIVACAINE-EPINEPHRINE (PF) 0.5% -1:200000 IJ SOLN
INTRAMUSCULAR | Status: DC | PRN
  Administered 2024-07-27 (×2): 15 mL via PERINEURAL

## 2024-07-27 MED ORDER — SENNA 8.6 MG PO TABS: 2.0000 | ORAL_TABLET | Freq: Two times a day (BID) | ORAL | 0 refills | Status: AC

## 2024-07-27 MED ORDER — FENTANYL CITRATE (PF) 100 MCG/2ML IJ SOLN
50.0000 ug | Freq: Once | INTRAMUSCULAR | Status: AC
  Administered 2024-07-27: 50 ug via INTRAVENOUS

## 2024-07-27 MED ORDER — ONDANSETRON HCL 4 MG/2ML IJ SOLN
INTRAMUSCULAR | Status: AC
  Filled 2024-07-27: qty 2

## 2024-07-27 MED ORDER — EPHEDRINE SULFATE (PRESSORS) 25 MG/5ML IV SOSY
PREFILLED_SYRINGE | INTRAVENOUS | Status: DC | PRN
  Administered 2024-07-27 (×3): 5 mg via INTRAVENOUS

## 2024-07-27 MED ORDER — RIVAROXABAN 10 MG PO TABS: 10.0000 mg | ORAL_TABLET | Freq: Every day | ORAL | 0 refills | Status: AC

## 2024-07-27 MED ORDER — OXYCODONE HCL 5 MG PO TABS: 5.0000 mg | ORAL_TABLET | Freq: Four times a day (QID) | ORAL | 0 refills | Status: AC | PRN

## 2024-07-27 MED ORDER — PHENYLEPHRINE 80 MCG/ML (10ML) SYRINGE FOR IV PUSH (FOR BLOOD PRESSURE SUPPORT)
PREFILLED_SYRINGE | INTRAVENOUS | Status: AC
  Filled 2024-07-27: qty 10

## 2024-07-27 MED ORDER — EPHEDRINE 5 MG/ML INJ
INTRAVENOUS | Status: AC
  Filled 2024-07-27: qty 5

## 2024-07-27 MED ORDER — PROPOFOL 500 MG/50ML IV EMUL
INTRAVENOUS | Status: DC | PRN
  Administered 2024-07-27: 125 ug/kg/min via INTRAVENOUS

## 2024-07-27 MED ORDER — DOCUSATE SODIUM 100 MG PO CAPS: 100.0000 mg | ORAL_CAPSULE | Freq: Two times a day (BID) | ORAL | 0 refills | Status: AC

## 2024-07-27 MED ORDER — FENTANYL CITRATE (PF) 100 MCG/2ML IJ SOLN: 25.0000 ug | INTRAMUSCULAR | Status: DC | PRN

## 2024-07-27 MED ORDER — FENTANYL CITRATE (PF) 100 MCG/2ML IJ SOLN
INTRAMUSCULAR | Status: DC | PRN
  Administered 2024-07-27: 50 ug via INTRAVENOUS

## 2024-07-27 MED ORDER — PROPOFOL 10 MG/ML IV BOLUS
INTRAVENOUS | Status: DC | PRN
  Administered 2024-07-27: 200 mg via INTRAVENOUS

## 2024-07-27 MED ORDER — MIDAZOLAM HCL 2 MG/2ML IJ SOLN
INTRAMUSCULAR | Status: AC
  Filled 2024-07-27: qty 2

## 2024-07-27 SURGICAL SUPPLY — 86 items
BIT DRILL 2.9 CANN QC NONSTRL (BIT) IMPLANT
BLADE RECIPRO TAPERED (BLADE) ×3 IMPLANT
BLADE SAW OSC ANKLE 8X63X1.19 (BLADE) IMPLANT
BLADE SAW RECIP ANKLE 8X50X1 (PIN) IMPLANT
BLADE SAW SGTL 13.0X1.19X90.0M (BLADE) IMPLANT
BLADE SURG 15 STRL LF DISP TIS (BLADE) ×9 IMPLANT
BNDG COMPR ESMARK 6X3 LF (GAUZE/BANDAGES/DRESSINGS) IMPLANT
BNDG ELASTIC 4INX 5YD STR LF (GAUZE/BANDAGES/DRESSINGS) ×6 IMPLANT
BNDG ELASTIC 6INX 5YD STR LF (GAUZE/BANDAGES/DRESSINGS) IMPLANT
CANISTER SUCT 1200ML W/VALVE (MISCELLANEOUS) ×3 IMPLANT
CHLORAPREP W/TINT 26 (MISCELLANEOUS) ×3 IMPLANT
CLIP LOCKING ANKLE SZ3 (Clip) IMPLANT
COMPONENT TALAR VT SZ 2 RT (Ankle) IMPLANT
COVER BACK TABLE 60X90IN (DRAPES) ×6 IMPLANT
COVER MAYO STAND STRL (DRAPES) ×3 IMPLANT
CUFF TRNQT CYL 34X4.125X (TOURNIQUET CUFF) ×3 IMPLANT
DRAPE C-ARM 42X72 X-RAY (DRAPES) ×3 IMPLANT
DRAPE C-ARMOR (DRAPES) ×3 IMPLANT
DRAPE EXTREMITY T 121X128X90 (DISPOSABLE) ×3 IMPLANT
DRAPE IMP U-DRAPE 54X76 (DRAPES) ×3 IMPLANT
DRAPE OEC MINIVIEW 54X84 (DRAPES) IMPLANT
DRAPE U-SHAPE 47X51 STRL (DRAPES) ×3 IMPLANT
DRESSING MEPILEX FLEX 4X4 (GAUZE/BANDAGES/DRESSINGS) ×3 IMPLANT
DRSG AQUACEL AG ADV 3.5X10 (GAUZE/BANDAGES/DRESSINGS) ×3 IMPLANT
DRSG MEPITEL 4X7.2 (GAUZE/BANDAGES/DRESSINGS) ×3 IMPLANT
DURAPREP 26ML APPLICATOR (WOUND CARE) ×6 IMPLANT
ELECTRODE REM PT RTRN 9FT ADLT (ELECTROSURGICAL) ×3 IMPLANT
GAUZE PAD ABD 8X10 STRL (GAUZE/BANDAGES/DRESSINGS) ×6 IMPLANT
GAUZE SPONGE 4X4 12PLY STRL (GAUZE/BANDAGES/DRESSINGS) ×3 IMPLANT
GLOVE BIO SURGEON STRL SZ8 (GLOVE) ×3 IMPLANT
GLOVE BIOGEL PI IND STRL 7.0 (GLOVE) IMPLANT
GLOVE BIOGEL PI IND STRL 7.5 (GLOVE) IMPLANT
GLOVE BIOGEL PI IND STRL 8 (GLOVE) ×6 IMPLANT
GLOVE ECLIPSE 8.0 STRL XLNG CF (GLOVE) ×3 IMPLANT
GLOVE SURG SS PI 7.0 STRL IVOR (GLOVE) IMPLANT
GOWN STRL REUS W/ TWL LRG LVL3 (GOWN DISPOSABLE) ×3 IMPLANT
GOWN STRL REUS W/ TWL XL LVL3 (GOWN DISPOSABLE) ×6 IMPLANT
GOWN STRL REUS W/TWL XL LVL3 (GOWN DISPOSABLE) ×3 IMPLANT
IMPLANT TIB INSERT 2 8 RT (Orthopedic Implant) IMPLANT
KWIRE ACE 1.6X6 (WIRE) IMPLANT
KWIRE DBL .062X4 NSTRL (WIRE) IMPLANT
NDL HYPO 22X1.5 SAFETY MO (MISCELLANEOUS) IMPLANT
NDL HYPO 25X1 1.5 SAFETY (NEEDLE) IMPLANT
NDL SUT 6 .5 CRC .975X.05 MAYO (NEEDLE) IMPLANT
PACK BASIN DAY SURGERY FS (CUSTOM PROCEDURE TRAY) ×3 IMPLANT
PAD CAST 4YDX4 CTTN HI CHSV (CAST SUPPLIES) ×6 IMPLANT
PADDING CAST ABS COTTON 4X4 ST (CAST SUPPLIES) IMPLANT
PADDING CAST COTTON 6X4 STRL (CAST SUPPLIES) ×3 IMPLANT
PASSER SUT SWANSON 36MM LOOP (INSTRUMENTS) IMPLANT
PENCIL SMOKE EVACUATOR (MISCELLANEOUS) ×3 IMPLANT
PIN POUCH TALAR VANTAGE 2.5 (PIN) IMPLANT
PIN POUCH TALAR VANTAGE 3.5 (PIN) IMPLANT
PLATE TIB FB SZ3 RT (Plate) IMPLANT
RETRIEVER SUT HEWSON (MISCELLANEOUS) IMPLANT
SANITIZER HAND ALTRA PUMP 550 (MISCELLANEOUS) ×3 IMPLANT
SCOTCHCAST PLUS 3X4 WHITE (CAST SUPPLIES) IMPLANT
SCOTCHCAST PLUS 4X4 WHITE (CAST SUPPLIES) IMPLANT
SCREW CORTICAL 3.5MM 70MM (Screw) IMPLANT
SHEET MEDIUM DRAPE 40X70 STRL (DRAPES) ×3 IMPLANT
SLEEVE SCD COMPRESS KNEE MED (STOCKING) ×3 IMPLANT
SOLN 0.9% NACL POUR BTL 1000ML (IV SOLUTION) ×3 IMPLANT
SPIKE FLUID TRANSFER (MISCELLANEOUS) IMPLANT
SPLINT PLASTER CAST FAST 5X30 (CAST SUPPLIES) IMPLANT
SPONGE T-LAP 18X18 ~~LOC~~+RFID (SPONGE) ×3 IMPLANT
STOCKINETTE 6 STRL (DRAPES) ×3 IMPLANT
STRIP CLOSURE SKIN 1/2X4 (GAUZE/BANDAGES/DRESSINGS) ×3 IMPLANT
SUCTION TUBE FRAZIER 10FR DISP (SUCTIONS) ×3 IMPLANT
SURGILUBE 2OZ TUBE FLIPTOP (MISCELLANEOUS) ×3 IMPLANT
SUT ETHIBOND 0 MO6 C/R (SUTURE) IMPLANT
SUT ETHIBOND 2 OS 4 DA (SUTURE) IMPLANT
SUT ETHILON 3 0 PS 1 (SUTURE) ×3 IMPLANT
SUT MNCRL AB 3-0 PS2 18 (SUTURE) ×3 IMPLANT
SUT STRATA 3-0 60 PS-1 (SUTURE) IMPLANT
SUT STRATA PDS 3-0 PS-1 (SUTURE) ×3 IMPLANT
SUT VIC AB 0 CT1 27XBRD ANBCTR (SUTURE) ×3 IMPLANT
SUT VIC AB 2-0 SH 27XBRD (SUTURE) IMPLANT
SUT VICRYL 0 UR6 27IN ABS (SUTURE) IMPLANT
SUTURE FIBERWR 2-0 18 17.9 3/8 (SUTURE) IMPLANT
SYR 20ML LL LF (SYRINGE) IMPLANT
SYR BULB EAR ULCER 3OZ GRN STR (SYRINGE) ×3 IMPLANT
SYR BULB IRRIG 60ML STRL (SYRINGE) ×3 IMPLANT
SYR CONTROL 10ML LL (SYRINGE) IMPLANT
TOWEL GREEN STERILE FF (TOWEL DISPOSABLE) ×6 IMPLANT
TUBE CONNECTING 20X1/4 (TUBING) ×3 IMPLANT
UNDERPAD 30X36 HEAVY ABSORB (UNDERPADS AND DIAPERS) ×3 IMPLANT
YANKAUER SUCT BULB TIP NO VENT (SUCTIONS) ×3 IMPLANT

## 2024-07-27 NOTE — Discharge Instructions (Addendum)
 Norleen Armor, MD [facility]  Please read the following information regarding your care after surgery.  Medications  You only need a prescription for the narcotic pain medicine (ex. oxycodone , Percocet, Norco).  All of the other medicines listed below are available over the counter. ? acetominophen (Tylenol ) 650 mg every 4-6 hours as you need for minor to moderate pain ? oxycodone  as prescribed for severe pain  Narcotic pain medicine (ex. oxycodone , Percocet, Vicodin) will cause constipation.  To prevent this problem, take the following medicines while you are taking any pain medicine. ? docusate sodium (Colace) 100 mg twice a day ? senna (Senokot) 2 tablets twice a day  ? To help prevent blood clots, take xarelto as prescribed for two weeks after surgery.  You should also get up every hour while you are awake to move around.    Weight Bearing ? Do not bear any weight on the operated leg or foot.  Cast / Splint / Dressing ? Keep your splint, cast or dressing clean and dry.  Don't put anything (coat hanger, pencil, etc) down inside of it.  If it gets damp, use a hair dryer on the cool setting to dry it.  If it gets soaked, call the office to schedule an appointment for a cast change.   After your dressing, cast or splint is removed; you may shower, but do not soak or scrub the wound.  Allow the water  to run over it, and then gently pat it dry.  Swelling It is normal for you to have swelling where you had surgery.  To reduce swelling and pain, keep your toes above your nose for at least 3 days after surgery.  It may be necessary to keep your foot or leg elevated for several weeks.  If it hurts, it should be elevated.  Follow Up Call my office at [phone] when you are discharged from the hospital or surgery center to schedule an appointment to be seen 3 weeks after surgery.  Call my office at [phone] if you develop a fever >101.5 F, nausea, vomiting, bleeding from the surgical  site or severe pain.     Post Anesthesia Home Care Instructions  Activity: Get plenty of rest for the remainder of the day. A responsible individual must stay with you for 24 hours following the procedure.  For the next 24 hours, DO NOT: -Drive a car -Advertising copywriter -Drink alcoholic beverages -Take any medication unless instructed by your physician -Make any legal decisions or sign important papers.  Meals: Start with liquid foods such as gelatin or soup. Progress to regular foods as tolerated. Avoid greasy, spicy, heavy foods. If nausea and/or vomiting occur, drink only clear liquids until the nausea and/or vomiting subsides. Call your physician if vomiting continues.  Special Instructions/Symptoms: Your throat may feel dry or sore from the anesthesia or the breathing tube placed in your throat during surgery. If this causes discomfort, gargle with warm salt water . The discomfort should disappear within 24 hours.  If you had a scopolamine patch placed behind your ear for the management of post- operative nausea and/or vomiting:  1. The medication in the patch is effective for 72 hours, after which it should be removed.  Wrap patch in a tissue and discard in the trash. Wash hands thoroughly with soap and water . 2. You may remove the patch earlier than 72 hours if you experience unpleasant side effects which may include dry mouth, dizziness or visual disturbances. 3. Avoid touching the patch. Wash your  hands with soap and water  after contact with the patch.      Regional Anesthesia Blocks  1. You may not be able to move or feel the blocked extremity after a regional anesthetic block. This may last may last from 3-48 hours after placement, but it will go away. The length of time depends on the medication injected and your individual response to the medication. As the nerves start to wake up, you may experience tingling as the movement and feeling returns to your extremity. If the  numbness and inability to move your extremity has not gone away after 48 hours, please call your surgeon.   2. The extremity that is blocked will need to be protected until the numbness is gone and the strength has returned. Because you cannot feel it, you will need to take extra care to avoid injury. Because it may be weak, you may have difficulty moving it or using it. You may not know what position it is in without looking at it while the block is in effect.  3. For blocks in the legs and feet, returning to weight bearing and walking needs to be done carefully. You will need to wait until the numbness is entirely gone and the strength has returned. You should be able to move your leg and foot normally before you try and bear weight or walk. You will need someone to be with you when you first try to ensure you do not fall and possibly risk injury.  4. Bruising and tenderness at the needle site are common side effects and will resolve in a few days.  5. Persistent numbness or new problems with movement should be communicated to the surgeon or the [facility] [phone] [facility] [phone]).   Next dose of tylenol  may be taken at 3p

## 2024-07-27 NOTE — Anesthesia Preprocedure Evaluation (Addendum)
 Anesthesia Evaluation  Patient identified by MRN, date of birth, ID band Patient awake    Reviewed: Allergy  & Precautions, NPO status , Patient's Chart, lab work & pertinent test results  History of Anesthesia Complications Negative for: history of anesthetic complications  Airway Mallampati: II  TM Distance: >3 FB Neck ROM: Full    Dental  (+) Missing,    Pulmonary asthma , former smoker   Pulmonary exam normal        Cardiovascular negative cardio ROS Normal cardiovascular exam     Neuro/Psych negative neurological ROS     GI/Hepatic Neg liver ROS,GERD  ,,  Endo/Other  negative endocrine ROS    Renal/GU negative Renal ROS     Musculoskeletal Instability of right ankle joint   Abdominal   Peds  Hematology negative hematology ROS (+)   Anesthesia Other Findings   Reproductive/Obstetrics                              Anesthesia Physical Anesthesia Plan  ASA: 2  Anesthesia Plan: General   Post-op Pain Management: Tylenol  PO (pre-op)* and Regional block*   Induction: Intravenous  PONV Risk Score and Plan: 2 and Treatment may vary due to age or medical condition, Ondansetron , Dexamethasone  and Midazolam   Airway Management Planned: LMA  Additional Equipment: None  Intra-op Plan:   Post-operative Plan: Extubation in OR  Informed Consent: I have reviewed the patients History and Physical, chart, labs and discussed the procedure including the risks, benefits and alternatives for the proposed anesthesia with the patient or authorized representative who has indicated his/her understanding and acceptance.     Dental advisory given  Plan Discussed with: CRNA  Anesthesia Plan Comments:          Anesthesia Quick Evaluation

## 2024-07-27 NOTE — Anesthesia Procedure Notes (Signed)
 Anesthesia Regional Block: Popliteal block   Pre-Anesthetic Checklist: , timeout performed,  Correct Patient, Correct Site, Correct Laterality,  Correct Procedure, Correct Position, site marked,  Risks and benefits discussed,  Pre-op evaluation,  At surgeon's request and post-op pain management  Laterality: Right  Prep: Maximum Sterile Barrier Precautions used, chloraprep       Needles:  Injection technique: Single-shot  Needle Type: Echogenic Stimulator Needle     Needle Length: 9cm  Needle Gauge: 22     Additional Needles:   Procedures:,,,, ultrasound used (permanent image in chart),,    Narrative:  Start time: 07/27/2024 9:39 AM End time: 07/27/2024 9:41 AM Injection made incrementally with aspirations every 5 mL.  Performed by: Personally  Anesthesiologist: Paul Lamarr BRAVO, MD  Additional Notes: Risks, benefits, and alternative discussed. Patient gave consent for procedure. Patient prepped and draped in sterile fashion. Sedation administered, patient remains easily responsive to voice. Relevant anatomy identified with ultrasound guidance. Local anesthetic given in 5cc increments with no signs or symptoms of intravascular injection. No pain or paraesthesias with injection. Patient monitored throughout procedure with no signs of LAST or immediate complications. Tolerated well. Ultrasound image placed in chart.  LANEY Paul, MD

## 2024-07-27 NOTE — Transfer of Care (Signed)
 Immediate Anesthesia Transfer of Care Note  Patient: Antonio Holmes  Procedure(s) Performed: Right total ankle replacement (Right: Ankle) Achilles tendon lengtehning (Right: Foot)  Patient Location: PACU  Anesthesia Type:GA combined with regional for post-op pain  Level of Consciousness: awake and patient cooperative  Airway & Oxygen Therapy: Patient Spontanous Breathing and Patient connected to face mask oxygen  Post-op Assessment: Report given to RN and Post -op Vital signs reviewed and stable  Post vital signs: Reviewed and stable  Last Vitals:  Vitals Value Taken Time  BP 124/76 07/27/24 12:45  Temp 36.3 C 07/27/24 12:45  Pulse 89 07/27/24 12:51  Resp 20 07/27/24 12:51  SpO2 98 % 07/27/24 12:51  Vitals shown include unfiled device data.  Last Pain:  Vitals:   07/27/24 1245  TempSrc:   PainSc: 0-No pain         Complications: No notable events documented.

## 2024-07-27 NOTE — Progress Notes (Signed)
Assisted Dr. Howze with right, adductor canal, popliteal, ultrasound guided block. Side rails up, monitors on throughout procedure. See vital signs in flow sheet. Tolerated Procedure well. 

## 2024-07-27 NOTE — H&P (Signed)
 Antonio Holmes is an 63 y.o. male.   Chief Complaint: right ankle pain HPI: 63 y/o male without significant PMH c/o R ankle pain worsening over years.  He has failed non op treatment and presents today for right total ankle replacement and possible achilles tenodn lengthening.  Past Medical History:  Diagnosis Date   Asthma    Cough     Past Surgical History:  Procedure Laterality Date   CATARACT EXTRACTION W/PHACO Left 12/11/2022   Procedure: CATARACT EXTRACTION PHACO AND INTRAOCULAR LENS PLACEMENT (IOC);  Surgeon: Harrie Agent, MD;  Location: [facility];  Service: Ophthalmology;  Laterality: Left;  CDE:9.97   CATARACT EXTRACTION W/PHACO Right 12/25/2022   Procedure: CATARACT EXTRACTION PHACO AND INTRAOCULAR LENS PLACEMENT (IOC);  Surgeon: Harrie Agent, MD;  Location: [facility];  Service: Ophthalmology;  Laterality: Right;  CDE: 9.83   KNEE SURGERY     WISDOM TOOTH EXTRACTION      Family History  Problem Relation Age of Onset   Hypertension Father    Allergic rhinitis Neg Hx    Angioedema Neg Hx    Asthma Neg Hx    Atopy Neg Hx    Eczema Neg Hx    Immunodeficiency Neg Hx    Urticaria Neg Hx    Social History:  reports that he quit smoking about 31 years ago. His smoking use included cigarettes. He started smoking about 32 years ago. He has a 0.5 pack-year smoking history. He has never used smokeless tobacco. He reports that he does not drink alcohol and does not use drugs.  Allergies:  Allergies  Allergen Reactions   Bee Venom Other (See Comments)   Tramadol      made my skin crawl   Tramadol  Hcl Other (See Comments)    Medications Prior to Admission  Medication Sig Dispense Refill   Black Elderberry (SAMBUCUS ELDERBERRY) 50 MG/5ML SYRP Take by mouth.     Budesonide  (PULMICORT  FLEXHALER) 90 MCG/ACT inhaler Inhale 2 puffs into the lungs 2 (two) times daily. 3 each 1   cyanocobalamin (VITAMIN B12) 1000 MCG tablet Take 1 tablet by mouth daily.     escitalopram (LEXAPRO) 10  MG tablet Take 10 mg by mouth daily.     famotidine  (PEPCID ) 20 MG tablet Take 1 tablet (20 mg total) by mouth 2 (two) times daily. 180 tablet 1   fluticasone  (FLONASE ) 50 MCG/ACT nasal spray Place 2 sprays into both nostrils daily. 16 g 5   levocetirizine (XYZAL ) 5 MG tablet Take 1 tablet (5 mg total) by mouth every evening. 90 tablet 1   Mometasone  Furoate (ASMANEX  HFA) 100 MCG/ACT AERO Inhale 2 puffs into the lungs in the morning. 13 g 5   montelukast  (SINGULAIR ) 10 MG tablet Take 1 tablet once at night for coughing or wheezing 90 tablet 3   Pediatric Multivitamins-Fl (MULTIVITAMINS/FL PO) See admin instructions.     tamsulosin  (FLOMAX ) 0.4 MG CAPS capsule Take 1 capsule (0.4 mg total) by mouth daily. 10 capsule 0   acetaminophen  (TYLENOL ) 325 MG tablet Take 650 mg by mouth every 6 (six) hours as needed.     albuterol  (VENTOLIN  HFA) 108 (90 Base) MCG/ACT inhaler Inhale 2 puffs into the lungs every 6 (six) hours as needed. 8 g 2   ascorbic acid (VITAMIN C) 500 MG tablet 1 tablet     Cholecalciferol (VITAMIN D3) 25 MCG (1000 UT) CAPS 1 capsule     DENTAGEL 1.1 % GEL dental gel Take by mouth as directed. (Patient not taking:  Reported on 04/12/2024)     EPINEPHrine  0.3 mg/0.3 mL IJ SOAJ injection Inject 0.3 mg into the muscle See admin instructions. 1 each 1    Results for orders placed or performed during the hospital encounter of August 09, 2024 (from the past 48 hours)  Surgical pcr screen     Status: None   Collection Time: 07/26/24  1:31 PM   Specimen: Nasal Mucosa; Nasal Swab  Result Value Ref Range   MRSA, PCR NEGATIVE NEGATIVE   Staphylococcus aureus NEGATIVE NEGATIVE    Comment: (NOTE) The Xpert SA Assay (FDA approved for NASAL specimens in patients 29 years of age and older), is one component of a comprehensive surveillance program. It is not intended to diagnose infection nor to guide or monitor treatment. Performed at [facility], [address], [place], [place] [address]     No results found.  Review of Systems  no recent f/c/n/v/wt loss  Height 5' 4 (1.626 m), weight 89 kg. Physical Exam  Wn wd male in nad.  A and O.  EOMI.  Resp unlabored.  R ankle with swelling and a small effusion.  Skin healthy.  5/5 strength in PF and DF.  No lymphadenopathy.  5/5 strength in PF and DF.  Pulses are palpable in the foot.   Assessment/Plan Right ankle arthritis and short achilles. To the OR today for right total ankle replacement and possible achilles tendon lengthening.  The risks and benefits of the alternative treatment options have been discussed in detail.  The patient wishes to proceed with surgery and specifically understands risks of bleeding, infection, nerve damage, blood clots, need for additional surgery, amputation and death.   Norleen Armor, MD 2024/08/09, 8:42 AM

## 2024-07-27 NOTE — Anesthesia Procedure Notes (Signed)
 Procedure Name: LMA Insertion Date/Time: 07/27/2024 10:39 AM  Performed by: Denton Niels CROME, CRNAPre-anesthesia Checklist: Patient identified, Emergency Drugs available, Suction available, Patient being monitored and Timeout performed Patient Re-evaluated:Patient Re-evaluated prior to induction Oxygen Delivery Method: Circle system utilized Preoxygenation: Pre-oxygenation with 100% oxygen Induction Type: IV induction Ventilation: Mask ventilation without difficulty LMA: LMA inserted LMA Size: 5.0 Number of attempts: 2 Placement Confirmation: positive ETCO2 Tube secured with: Tape Dental Injury: Teeth and Oropharynx as per pre-operative assessment

## 2024-07-27 NOTE — Anesthesia Postprocedure Evaluation (Signed)
 Anesthesia Post Note  Patient: Antonio Holmes  Procedure(s) Performed: Right total ankle replacement (Right: Ankle) Achilles tendon lengtehning (Right: Foot)     Patient location during evaluation: PACU Anesthesia Type: General Level of consciousness: awake and alert Pain management: pain level controlled Vital Signs Assessment: post-procedure vital signs reviewed and stable Respiratory status: spontaneous breathing, nonlabored ventilation and respiratory function stable Cardiovascular status: blood pressure returned to baseline Postop Assessment: no apparent nausea or vomiting Anesthetic complications: no   No notable events documented.  Last Vitals:  Vitals:   07/27/24 1315 07/27/24 1322  BP: 125/81   Pulse: 86 92  Resp: 13 16  Temp:    SpO2: 99% 95%    Last Pain:  Vitals:   07/27/24 1322  TempSrc:   PainSc: 0-No pain                 Vertell Row

## 2024-07-27 NOTE — Anesthesia Procedure Notes (Signed)
 Anesthesia Regional Block: Adductor canal block   Pre-Anesthetic Checklist: , timeout performed,  Correct Patient, Correct Site, Correct Laterality,  Correct Procedure, Correct Position, site marked,  Risks and benefits discussed,  Pre-op evaluation,  At surgeon's request and post-op pain management  Laterality: Right  Prep: Maximum Sterile Barrier Precautions used, chloraprep       Needles:  Injection technique: Single-shot  Needle Type: Echogenic Stimulator Needle     Needle Length: 9cm  Needle Gauge: 22     Additional Needles:   Procedures:,,,, ultrasound used (permanent image in chart),,    Narrative:  Start time: 07/27/2024 9:36 AM End time: 07/27/2024 9:39 AM Injection made incrementally with aspirations every 5 mL.  Performed by: Personally  Anesthesiologist: Paul Lamarr BRAVO, MD  Additional Notes: Risks, benefits, and alternative discussed. Patient gave consent for procedure. Patient prepped and draped in sterile fashion. Sedation administered, patient remains easily responsive to voice. Relevant anatomy identified with ultrasound guidance. Local anesthetic given in 5cc increments with no signs or symptoms of intravascular injection. No pain or paraesthesias with injection. Patient monitored throughout procedure with no signs of LAST or immediate complications. Tolerated well. Ultrasound image placed in chart.  LANEY Paul, MD

## 2024-07-27 NOTE — Op Note (Signed)
 07/27/2024  12:40 PM  PATIENT:  Antonio Holmes  63 y.o. male  PRE-OPERATIVE DIAGNOSIS:  1.  Post traumatic right ankle arthritis     2.  Short right achilles tendon     3.  Chronic right ankle instability  POST-OPERATIVE DIAGNOSIS:  same  Procedure(s):  Right total ankle replacement ([employer] Vantage size 3 tibia, size 2 talus and 8 mm poly) Right achilles tendon lengthening Insertion of prophylactic tibial (medial malleolus) screw  SURGEON:  Norleen Armor, MD  ASSISTANT: Eva Barrack, PA-C  ANESTHESIA:   General, regional  EBL:  minimal   TOURNIQUET:   Total Tourniquet Time Documented: Thigh (Right) - 93 minutes Total: Thigh (Right) - 93 minutes  COMPLICATIONS:  None apparent  DISPOSITION:  Extubated, awake and stable to recovery.  INDICATION FOR PROCEDURE: 64 year old male without significant past medical history complains of worsening right ankle pain due to posttraumatic arthritis and chronic ankle instability.  He also has a tight heel cord.  He has failed nonoperative treatment including activity modification, oral anti-inflammatories, bracing and shoewear modification.  He presents now for right total ankle replacement and possible Achilles tendon lengthening and lateral ligament reconstruction.  The risks and benefits of the alternative treatment options have been discussed in detail.  The patient wishes to proceed with surgery and specifically understands risks of bleeding, infection, nerve damage, blood clots, need for additional surgery, amputation and death.   PROCEDURE IN DETAIL:  After pre operative consent was obtained, and the correct operative site was identified, the patient was brought to the operating room and placed supine on the OR table.  Anesthesia was administered.  Pre-operative antibiotics were administered.  A surgical timeout was taken.  The right lower extremity was prepped and draped in standard sterile fashion with a tourniquet around the thigh.  The  extremity was elevated, and the tourniquet was inflated to 250 mmHg.  A longitudinal incision was made over the anterior ankle at the EHL.  The extensor retinaculum was released proximally and distally along the course of the EHL.  The interval between the tibialis anterior and EHL was developed.  The neurovascular bundle was identified.  It was mobilized and retracted laterally and protected throughout the case.  The anterior joint capsule was incised and elevated medially and laterally.  The deep deltoid ligament was released and the superficial deltoid elevated.  Anterior osteophytes were removed with a curved osteotome.  The external tibial alignment guide was attached proximally with a pin through the tibial tubercle.  It was positioned over the tibial shaft and provisionally pinned at the metaphysis distally.  Rotation of the cutting guide was set in line with the medial gutter.  A lateral view was obtained.  The slope was set appropriately along with the resection height.  An AP view was then obtained and the guide positioned in line with the medial gutter.  The distal cutting guide was then pinned in place.  The oscillating saw was used to cut the distal tibia followed by reciprocating saw to cut at the medial malleolus.  Waste bone was removed.  The talar cutting guide was then inserted.  A lateral view was obtained positioning the guide appropriately.  The guide was pinned in position.  The talar cut was made.  The size 3 sizer was inserted and was noted to fit appropriately.  The size 2 lollipop was positioned over the talus and pinned.  The lollipop was removed and replaced with a size 2 cutting guide.  The  posterior talar cut was made followed by milling the 2 anterior slots.  The center pins were removed and the second posterior cut made with a saw.  All waste bone was removed.  The cut surface was smoothed with a curved rasp.  The tibia was measured as a size 3.  A size 2 talar trial was inserted and  was noted to fit appropriately on x-ray.  It was pinned in position.  The size 3 tibial trial was inserted followed by an 8 mm spacer.  The ankle range of motion was noted to be appropriate.  Medial and lateral ligament balance was also noted to be appropriate.  The lug holes were drilled in the talar implant.  The trial implant was removed.  The tibial peg holes were punched centrally and then peripherally.  The tibial trial was then removed.  The wound was irrigated copiously with surgery for.  The tibial implant was positioned and impacted under fluoroscopic guidance.  The talar implant was then inserted and impacted into position.  A size 8 mm trial was inserted and was noted to fit appropriately.  The heel cord was noted to still be tight.  The wound was irrigated again copiously after removing the trial spacer.  An 8 mm polyethylene spacer was inserted followed by a locking clip.  AP, mortise and lateral radiographs confirmed appropriate position of the implants.  The medial malleolus was noted to be a little thin adjacent to the tibial implant.  The decision was made to insert a prophylactic screw.  A guidepin was inserted into the medial malleolus and advanced obliquely into the metaphyseal bone of the distal tibia taking care to avoid the tibial implant.  The guidepin was overdrilled and removed.  A 70 mm x 3.5 mm [employer] small frag screw was inserted without difficulty.  Radiographs were repeated showing appropriate position of the prophylactic screw.    An Achilles tendon lengthening was then performed with a #15 scalpel with 3 hemisections.  Ankle dorsiflexion was approximately 20 degrees after the Achilles was lengthened.  The wound was irrigated copiously and sprinkled with vancomycin powder.  The extensor retinaculum was repaired with figure-of-eight and running 0 Vicryl.  Subcutaneous tissues were approximated with inverted simple sutures of 3-0 Monocryl.  The skin incision was closed with  a running subcuticular barbed 3-0 Monocryl and then covered by Steri-Strips.  Sterile dressings were applied followed by a well-padded short leg cast.  The proximal knee incision was irrigated and closed with nylon and covered with a dry dressing and Ace wrap.  The tourniquet was released after application of the dressings.  The patient was awakened from anesthesia and transported to the recovery room in stable condition.   FOLLOW UP PLAN: Nonweightbearing on the right lower extremity.  Follow-up in the office in 3 weeks for suture removal and conversion to a cam boot if the wounds look adequately healed.  Xarelto for DVT prophylaxis.     Justin Ollis PA-C was present and scrubbed for the duration of the operative case. His assistance was essential in positioning the patient, prepping and draping, gaining and maintaining exposure, performing the operation, closing and dressing the wounds and applying the splint.

## 2024-08-01 ENCOUNTER — Encounter (HOSPITAL_BASED_OUTPATIENT_CLINIC_OR_DEPARTMENT_OTHER): Payer: Self-pay | Admitting: Orthopedic Surgery

## 2024-10-02 ENCOUNTER — Ambulatory Visit: Admitting: Physical Therapy

## 2024-10-25 ENCOUNTER — Ambulatory Visit: Admitting: Allergy & Immunology
